# Patient Record
Sex: Male | Born: 1985 | Hispanic: Yes | Marital: Married | Smoking: Never smoker
Health system: Southern US, Community
[De-identification: ages and names within clinical notes are randomized; demographics above are authoritative.]

## PROBLEM LIST (undated history)

## (undated) DIAGNOSIS — J45909 Unspecified asthma, uncomplicated: Secondary | ICD-10-CM

## (undated) DIAGNOSIS — K219 Gastro-esophageal reflux disease without esophagitis: Secondary | ICD-10-CM

## (undated) HISTORY — DX: Gastro-esophageal reflux disease without esophagitis: K21.9

## (undated) HISTORY — DX: Unspecified asthma, uncomplicated: J45.909

---
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
## 2004-06-28 ENCOUNTER — Emergency Department (HOSPITAL_COMMUNITY): Admission: EM | Admit: 2004-06-28 | Discharge: 2004-06-28 | Payer: Self-pay | Admitting: Emergency Medicine

## 2004-07-10 ENCOUNTER — Emergency Department (HOSPITAL_COMMUNITY): Admission: EM | Admit: 2004-07-10 | Discharge: 2004-07-10 | Payer: Self-pay | Admitting: Family Medicine

## 2008-11-15 ENCOUNTER — Emergency Department (HOSPITAL_COMMUNITY): Admission: EM | Admit: 2008-11-15 | Discharge: 2008-11-15 | Payer: Self-pay | Admitting: Emergency Medicine

## 2009-04-08 ENCOUNTER — Emergency Department (HOSPITAL_COMMUNITY): Admission: EM | Admit: 2009-04-08 | Discharge: 2009-04-08 | Payer: Self-pay | Admitting: Emergency Medicine

## 2009-12-17 ENCOUNTER — Emergency Department (HOSPITAL_COMMUNITY): Admission: EM | Admit: 2009-12-17 | Discharge: 2009-12-17 | Payer: Self-pay | Admitting: Emergency Medicine

## 2010-06-19 LAB — RAPID STREP SCREEN (MED CTR MEBANE ONLY): Streptococcus, Group A Screen (Direct): NEGATIVE

## 2010-07-13 LAB — COMPREHENSIVE METABOLIC PANEL
ALT: 25 U/L (ref 0–53)
AST: 18 U/L (ref 0–37)
Calcium: 7.8 mg/dL — ABNORMAL LOW (ref 8.4–10.5)
GFR calc Af Amer: >60 mL/min (ref >60)
Glucose, Bld: 113 mg/dL — ABNORMAL HIGH (ref 70–99)
Sodium: 134 mEq/L — ABNORMAL LOW (ref 135–145)
Total Protein: 6.2 g/dL (ref 6.0–8.3)

## 2010-07-13 LAB — DIFFERENTIAL
Basophils Absolute: 0 10*3/uL (ref 0.0–0.1)
Lymphocytes Relative: 6 % — ABNORMAL LOW (ref 12–46)
Monocytes Relative: 7 % (ref 3–12)
Neutrophils Relative %: 87 % — ABNORMAL HIGH (ref 43–77)

## 2010-07-13 LAB — CBC
Platelets: 227 10*3/uL (ref 150–400)
RDW: 13.4 % (ref 11.5–15.5)

## 2010-07-13 LAB — CK: Total CK: 107 U/L (ref 7–232)

## 2010-12-01 ENCOUNTER — Emergency Department (HOSPITAL_COMMUNITY)
Admission: EM | Admit: 2010-12-01 | Discharge: 2010-12-02 | Disposition: A | Discharge status: Home / Self Care | Payer: Self-pay | Attending: Emergency Medicine | Admitting: Emergency Medicine

## 2010-12-01 DIAGNOSIS — H6690 Otitis media, unspecified, unspecified ear: Secondary | ICD-10-CM | POA: Insufficient documentation

## 2010-12-01 DIAGNOSIS — H9209 Otalgia, unspecified ear: Secondary | ICD-10-CM | POA: Insufficient documentation

## 2010-12-01 DIAGNOSIS — H9190 Unspecified hearing loss, unspecified ear: Secondary | ICD-10-CM | POA: Insufficient documentation

## 2010-12-01 DIAGNOSIS — H60399 Other infective otitis externa, unspecified ear: Secondary | ICD-10-CM | POA: Insufficient documentation

## 2010-12-15 ENCOUNTER — Emergency Department (HOSPITAL_COMMUNITY)
Admission: EM | Admit: 2010-12-15 | Discharge: 2010-12-15 | Disposition: A | Discharge status: Home / Self Care | Payer: Self-pay | Attending: Emergency Medicine | Admitting: Emergency Medicine

## 2010-12-15 DIAGNOSIS — H9210 Otorrhea, unspecified ear: Secondary | ICD-10-CM | POA: Insufficient documentation

## 2010-12-15 DIAGNOSIS — H60399 Other infective otitis externa, unspecified ear: Secondary | ICD-10-CM | POA: Insufficient documentation

## 2010-12-15 DIAGNOSIS — H9209 Otalgia, unspecified ear: Secondary | ICD-10-CM | POA: Insufficient documentation

## 2011-02-23 ENCOUNTER — Emergency Department (HOSPITAL_COMMUNITY)
Admission: EM | Admit: 2011-02-23 | Discharge: 2011-02-24 | Disposition: A | Discharge status: Home / Self Care | Payer: Self-pay | Attending: Emergency Medicine | Admitting: Emergency Medicine

## 2011-02-23 ENCOUNTER — Encounter: Payer: Self-pay | Admitting: *Deleted

## 2011-02-23 DIAGNOSIS — H9209 Otalgia, unspecified ear: Secondary | ICD-10-CM | POA: Insufficient documentation

## 2011-02-23 DIAGNOSIS — H9312 Tinnitus, left ear: Secondary | ICD-10-CM

## 2011-02-23 DIAGNOSIS — H6692 Otitis media, unspecified, left ear: Secondary | ICD-10-CM

## 2011-02-23 DIAGNOSIS — H9319 Tinnitus, unspecified ear: Secondary | ICD-10-CM | POA: Insufficient documentation

## 2011-02-23 DIAGNOSIS — H6690 Otitis media, unspecified, unspecified ear: Secondary | ICD-10-CM | POA: Insufficient documentation

## 2011-02-23 MED ORDER — ONDANSETRON HCL 4 MG/2ML IJ SOLN: 4.0000 mg | Freq: Four times a day (QID) | INTRAMUSCULAR | Status: DC | PRN

## 2011-02-23 MED ORDER — HYDROMORPHONE HCL 2 MG PO TABS: 2.0000 mg | ORAL_TABLET | ORAL | Status: DC | PRN

## 2011-02-23 NOTE — ED Notes (Signed)
He has had lt ear pain for 3-4 months and he has been here 3-4 times for the same complaint and it does not appear to be getting better

## 2011-02-24 MED ORDER — AMOXICILLIN-POT CLAVULANATE 875-125 MG PO TABS
1.0000 | ORAL_TABLET | Freq: Once | ORAL | Status: AC
  Administered 2011-02-24: 1 via ORAL
  Filled 2011-02-24: qty 1

## 2011-02-24 MED ORDER — AMOXICILLIN-POT CLAVULANATE 875-125 MG PO TABS: 1.0000 | ORAL_TABLET | Freq: Two times a day (BID) | ORAL | Status: AC

## 2011-02-24 NOTE — ED Provider Notes (Signed)
History     CSN: [id number] Arrival date & time: 02/23/2011 10:07 PM   First MD Initiated Contact with Patient 02/24/11 0043      Chief Complaint  Patient presents with  . Ear Fullness    (Consider location/radiation/quality/duration/timing/severity/associated sxs/prior treatment) HPI Comments: Patient with recurrent L ear pain , ringing in the ear has been seen in the ED several times but has not made appointment with ENT for followup   Patient is a 25 y.o. male presenting with plugged ear sensation. The history is provided by the patient.  Ear Fullness This is a recurrent problem. The current episode started more than 1 month ago. The problem occurs intermittently. The problem has been gradually worsening. Associated symptoms include a fever. Pertinent negatives include no congestion, coughing, nausea or neck pain. The symptoms are aggravated by drinking. He has tried nothing for the symptoms. The treatment provided no relief.    History reviewed. No pertinent past medical history.  History reviewed. No pertinent past surgical history.  History reviewed. No pertinent family history.  History  Substance Use Topics  . Smoking status: Never Smoker   . Smokeless tobacco: Not on file  . Alcohol Use: Yes      Review of Systems  Constitutional: Positive for fever.  HENT: Positive for ear pain and tinnitus. Negative for congestion and neck pain.   Eyes: Negative.   Respiratory: Negative for cough.   Cardiovascular: Negative.   Gastrointestinal: Negative for nausea.  Genitourinary: Negative.   Musculoskeletal: Negative.   Neurological: Negative.   Hematological: Negative.   Psychiatric/Behavioral: Negative.     Allergies  Review of patient's allergies indicates no known allergies.  Home Medications  No current outpatient prescriptions on file.  BP 119/76  Pulse 73  Temp(Src) 97.9 F (36.6 C) (Oral)  Resp 18  SpO2 97%  Physical Exam  Constitutional: He is  oriented to person, place, and time. He appears well-developed and well-nourished.  HENT:  Head: Atraumatic.  Right Ear: Hearing normal. There is tenderness. No middle ear effusion.  Left Ear: No mastoid tenderness. A middle ear effusion is present. Decreased hearing is noted.  Eyes: EOM are normal.  Neck: Neck supple.  Cardiovascular: Regular rhythm.   Pulmonary/Chest: Breath sounds normal.  Abdominal: Bowel sounds are normal.  Musculoskeletal: Normal range of motion.  Neurological: He is oriented to person, place, and time.  Skin: Skin is warm and dry.  Psychiatric: He has a normal mood and affect.    ED Course  Procedures (including critical care time)  Labs Reviewed - No data to display No results found.   No diagnosis found.    MDM  Tinnitis, verse OMwith effusion vs, otic tumor        Arman Filter, NP 02/24/11 0106

## 2011-02-24 NOTE — ED Provider Notes (Signed)
Medical screening examination/treatment/procedure(s) were performed by non-physician practitioner and as supervising physician I was immediately available for consultation/collaboration.   Carlisle Beers Mycah Formica, MD 02/24/11 0700

## 2011-07-30 ENCOUNTER — Emergency Department (HOSPITAL_COMMUNITY): Payer: Self-pay

## 2011-07-30 ENCOUNTER — Other Ambulatory Visit: Payer: Self-pay

## 2011-07-30 ENCOUNTER — Encounter (HOSPITAL_COMMUNITY): Payer: Self-pay | Admitting: Emergency Medicine

## 2011-07-30 ENCOUNTER — Emergency Department (HOSPITAL_COMMUNITY)
Admission: EM | Admit: 2011-07-30 | Discharge: 2011-07-30 | Disposition: A | Discharge status: Home / Self Care | Payer: Self-pay | Attending: Emergency Medicine | Admitting: Emergency Medicine

## 2011-07-30 DIAGNOSIS — R05 Cough: Secondary | ICD-10-CM | POA: Insufficient documentation

## 2011-07-30 DIAGNOSIS — R079 Chest pain, unspecified: Secondary | ICD-10-CM | POA: Insufficient documentation

## 2011-07-30 DIAGNOSIS — R0789 Other chest pain: Secondary | ICD-10-CM

## 2011-07-30 DIAGNOSIS — M546 Pain in thoracic spine: Secondary | ICD-10-CM | POA: Insufficient documentation

## 2011-07-30 DIAGNOSIS — R059 Cough, unspecified: Secondary | ICD-10-CM | POA: Insufficient documentation

## 2011-07-30 DIAGNOSIS — J40 Bronchitis, not specified as acute or chronic: Secondary | ICD-10-CM | POA: Insufficient documentation

## 2011-07-30 LAB — CBC
Hemoglobin: 16 g/dL (ref 13.0–17.0)
MCH: 31.1 pg (ref 26.0–34.0)
RBC: 5.15 MIL/uL (ref 4.22–5.81)

## 2011-07-30 LAB — BASIC METABOLIC PANEL
CO2: 24 mEq/L (ref 19–32)
Calcium: 9.3 mg/dL (ref 8.4–10.5)
Glucose, Bld: 112 mg/dL — ABNORMAL HIGH (ref 70–99)
Potassium: 3.9 mEq/L (ref 3.5–5.1)
Sodium: 139 mEq/L (ref 135–145)

## 2011-07-30 LAB — D-DIMER, QUANTITATIVE: D-Dimer, Quant: 0.31 ug/mL-FEU (ref 0.00–0.48)

## 2011-07-30 MED ORDER — ASPIRIN 325 MG PO TABS: 325.0000 mg | ORAL_TABLET | ORAL | Status: DC

## 2011-07-30 MED ORDER — IBUPROFEN 800 MG PO TABS
800.0000 mg | ORAL_TABLET | Freq: Once | ORAL | Status: AC
  Administered 2011-07-30: 800 mg via ORAL
  Filled 2011-07-30: qty 1

## 2011-07-30 MED ORDER — ALBUTEROL SULFATE HFA 108 (90 BASE) MCG/ACT IN AERS
2.0000 | INHALATION_SPRAY | RESPIRATORY_TRACT | Status: DC | PRN
  Filled 2011-07-30: qty 6.7

## 2011-07-30 NOTE — ED Provider Notes (Signed)
History     CSN: [id number]  Arrival date & time 07/30/11  1306   First MD Initiated Contact with Patient 07/30/11 1440      Chief Complaint  Patient presents with  . Chest Pain    (Consider location/radiation/quality/duration/timing/severity/associated sxs/prior treatment) HPI Patient presents with complaint of intermittent sharp pains in the middle of his chest which has been going on over the past few days. The chest pains come and go. He initially experienced pain while he was lying in bed. Pain is described as sharp lasting several seconds and then resolve spontaneously. He occasionally feels some pain in his mid upper back as well. He has no shortness of breath. He does describe having a cough which began yesterday. His family member at bedside states that she has heard him wheezing when he coughs. He denies any leg swelling he has no history of DVT or PE he's had no recent surgery or trauma and no recent travel. He's had no fever or chills. His cough is nonproductive. He's been eating and drinking normally. He has no nausea or diaphoresis radiation of the pain or shortness of breath associated. There are no alleviating or modifying factors. There no other associated systemic symptoms.  History reviewed. No pertinent past medical history.  History reviewed. No pertinent past surgical history.  No family history on file.  History  Substance Use Topics  . Smoking status: Current Some Day Smoker  . Smokeless tobacco: Not on file  . Alcohol Use: No      Review of Systems ROS reviewed and all otherwise negative except for mentioned in HPI  Allergies  Review of patient's allergies indicates no known allergies.  Home Medications  No current outpatient prescriptions on file.  BP 138/78  Pulse 89  Temp(Src) 97.6 F (36.4 C) (Oral)  Resp 20  Wt 210 lb (95.255 kg)  SpO2 96% Vitals reviewed Physical Exam Physical Examination: General appearance - alert, well appearing,  and in no distress Mental status - alert, oriented to person, place, and time Mouth - mucous membranes moist, pharynx normal without lesions Chest - clear to auscultation, no wheezes, rales or rhonchi, symmetric air entry, mild ttp of anterior chest wall- costochondral junction Heart - normal rate, regular rhythm, normal S1, S2, no murmurs, rubs, clicks or gallops Abdomen - soft, nontender, nondistended, no masses or organomegaly Extremities - peripheral pulses normal, no pedal edema, no clubbing or cyanosis Skin - normal coloration and turgor, no rashes, no suspicious skin lesions noted Psych-laughing and jovial during exam  ED Course  Procedures (including critical care time)   Date: 07/30/2011  Rate: 96  Rhythm: normal sinus rhythm  QRS Axis: rightward  Intervals: normal  ST/T Wave abnormalities: normal  Conduction Disutrbances:none  Narrative Interpretation:   Old EKG Reviewed: none available    2:49 PM went to see patient, he is gone for xray  Labs Reviewed  CBC - Abnormal; Notable for the following:    WBC 12.1 (*)    All other components within normal limits  BASIC METABOLIC PANEL - Abnormal; Notable for the following:    Glucose, Bld 112 (*)    All other components within normal limits  PRO B NATRIURETIC PEPTIDE  D-DIMER, QUANTITATIVE   Dg Chest 2 View  07/30/2011  *RADIOLOGY REPORT*  Clinical Data: Chest pain  CHEST - 2 VIEW  Comparison: None  Findings: The heart size and mediastinal contours are within normal limits.  Both lungs are clear.  The visualized skeletal structures  are unremarkable.  IMPRESSION: Negative exam.  Original Report Authenticated By: Rosealee Albee, M.D.     1. Bronchitis   2. Chest wall pain       MDM  Patient presenting with sharp pains in his chest intermittently over the past 2 days. He also has a cough. His workup today in the ED revealed no significant abnormalities of his EKG. He is very low risk for acute coronary syndrome and  I do not believe his symptoms represent acute coronary syndrome. He was tachycardiac so a d-dimer was performed which was within normal range. His chest x-ray did not show any abnormalities. I suspect this pain to be musculoskeletal in nature or related to possible bronchitis. I recommended he take ibuprofen for discomfort and he was given an albuterol inhaler in the emergency department. He was given strict return precautions and patient is agreeable with this plan the        Ethelda Chick, MD 07/30/11 1651

## 2011-07-30 NOTE — ED Notes (Signed)
Pt reports CP onset last night, radiates to back and is associated with SOB, no other complaints,NAD

## 2011-07-30 NOTE — Discharge Instructions (Signed)
Return to the ED with any concerns including difficulty breathing, swelling in your legs, fainting, or any other alarming symptoms.  He should use the albuterol inhaler 2 puffs every 4 hours as needed for cough.

## 2011-07-31 ENCOUNTER — Encounter: Payer: Self-pay | Admitting: *Deleted

## 2011-10-02 ENCOUNTER — Emergency Department (HOSPITAL_COMMUNITY)
Admission: EM | Admit: 2011-10-02 | Discharge: 2011-10-03 | Disposition: A | Discharge status: Home / Self Care | Payer: Self-pay | Attending: Emergency Medicine | Admitting: Emergency Medicine

## 2011-10-02 ENCOUNTER — Emergency Department (HOSPITAL_COMMUNITY): Payer: Self-pay

## 2011-10-02 ENCOUNTER — Encounter (HOSPITAL_COMMUNITY): Payer: Self-pay | Admitting: Emergency Medicine

## 2011-10-02 DIAGNOSIS — R109 Unspecified abdominal pain: Secondary | ICD-10-CM | POA: Insufficient documentation

## 2011-10-02 DIAGNOSIS — R079 Chest pain, unspecified: Secondary | ICD-10-CM | POA: Insufficient documentation

## 2011-10-02 DIAGNOSIS — R0789 Other chest pain: Secondary | ICD-10-CM

## 2011-10-02 DIAGNOSIS — R071 Chest pain on breathing: Secondary | ICD-10-CM | POA: Insufficient documentation

## 2011-10-02 DIAGNOSIS — R5381 Other malaise: Secondary | ICD-10-CM | POA: Insufficient documentation

## 2011-10-02 DIAGNOSIS — R0602 Shortness of breath: Secondary | ICD-10-CM | POA: Insufficient documentation

## 2011-10-02 MED ORDER — IBUPROFEN 800 MG PO TABS
800.0000 mg | ORAL_TABLET | Freq: Once | ORAL | Status: AC
  Administered 2011-10-02: 800 mg via ORAL
  Filled 2011-10-02: qty 1

## 2011-10-02 MED ORDER — ALBUTEROL SULFATE HFA 108 (90 BASE) MCG/ACT IN AERS
2.0000 | INHALATION_SPRAY | RESPIRATORY_TRACT | Status: DC | PRN
  Administered 2011-10-02: 2 via RESPIRATORY_TRACT
  Filled 2011-10-02: qty 6.7

## 2011-10-02 NOTE — ED Notes (Signed)
Updated in w/r 

## 2011-10-02 NOTE — ED Notes (Signed)
Reported wait time.  Blankets offered 

## 2011-10-02 NOTE — ED Notes (Signed)
Pt. Stated, I was at work 2 days ago and i started having some rt. Side rib pain,  Comes and goes.  Either moving or being still

## 2011-10-02 NOTE — ED Notes (Signed)
Pt c/o sharp pain , RUQ,  sharp onset 2 days ago but constant since last PM.  Pain worsens with deep cough.  Denies nausea, vomiing, diarrhea, chest pain.  C/o of difficulty breathing at rest. Denies prior episode of same.

## 2011-10-02 NOTE — ED Notes (Signed)
Status unchanged.  Awaiting plan of care.

## 2011-10-03 MED ORDER — AZITHROMYCIN 250 MG PO TABS: ORAL_TABLET | ORAL | Status: AC

## 2011-10-03 NOTE — Discharge Instructions (Signed)
Return to the ED with any concerns including difficulty breathing, fainting, leg swelling, fever, decreased level of alertness/lethargy, or any other alarming symptoms  You should use the albuterol inhaler 2 puffs every 4 hours  Your CXR did not show a definite pneumonia, but there may be an area of early pneumonia- antibiotic prescription given

## 2011-10-03 NOTE — ED Provider Notes (Signed)
History     CSN: [id number]  Arrival date & time 10/02/11  1331   First MD Initiated Contact with Patient 10/02/11 2039      Chief Complaint  Patient presents with  . Abdominal Pain    (Consider location/radiation/quality/duration/timing/severity/associated sxs/prior treatment) HPI Patient presents with complaint of right-sided pain in his ribs. He states that the pain has been going on for the past 2 days. Pain is sharp and fleeting in nature. It is not associated with movement or palpation. He does describe a mild cough which is nonproductive. He's had no fever or chills. No leg swelling and no history of DVT or PE. He has had no recent surgery or travel or trauma. He has not tried any treatments for his pain. He has had similar symptoms in the past and was treated for bronchitis and improved. There no other alleviating or modifying factors. There no other associated systemic symptoms.  History reviewed. No pertinent past medical history.  History reviewed. No pertinent past surgical history.  No family history on file.  History  Substance Use Topics  . Smoking status: Current Some Day Smoker  . Smokeless tobacco: Not on file  . Alcohol Use: No      Review of Systems ROS reviewed and all otherwise negative except for mentioned in HPI Allergies  Review of patient's allergies indicates no known allergies.  Home Medications   Current Outpatient Rx  Name Route Sig Dispense Refill  . AZITHROMYCIN 250 MG PO TABS  Take 2 tabs po on Day 1, then 1 tab po qD x total of 5 days 6 each 0    BP 132/86  Pulse 80  Temp 97.6 F (36.4 C) (Oral)  Resp 16  SpO2 98% Vitals reviewed Physical Exam Physical Examination: General appearance - alert, well appearing, and in no distress Mental status - alert, oriented to person, place, and time Eyes - pupils equal and reactive, no scleral icterus or conjunctival injection Mouth - mucous membranes moist, pharynx normal without  lesions Chest - clear to auscultation, no wheezes, rales or rhonchi, symmetric air entry, chest wall nontender, no crepitus Heart - normal rate, regular rhythm, normal S1, S2, no murmurs, rubs, clicks or gallops Abdomen - soft, nontender, nondistended, no masses or organomegaly Musculoskeletal - no joint tenderness, deformity or swelling Extremities - peripheral pulses normal, no pedal edema, no clubbing or cyanosis Skin - normal coloration and turgor, no rashes  ED Course  Procedures (including critical care time)  Labs Reviewed - No data to display Dg Chest 2 View  10/02/2011  *RADIOLOGY REPORT*  Clinical Data: Right side chest pain, shortness of breath, weakness, occasional smoker  CHEST - 2 VIEW  Comparison: None  Findings: Upper-normal size of cardiac silhouette. Mediastinal contours and pulmonary vascularity normal. Minimal bronchitic changes and right basilar atelectasis. Lungs otherwise clear. No pleural effusion or pneumothorax. No acute osseous findings.  IMPRESSION: Minimal bronchitic changes and right basilar atelectasis.  Original Report Authenticated By: Lollie Marrow, M.D.     1. Chest wall pain       MDM  Patient presenting with right-sided chest wall pain with associated mild cough. Chest x-ray is reassuring.  I doubt ACS or other emergent condition at this time. Patient was treated with albuterol inhaler and advised ibuprofen for soreness. He was given strict return precautions and is agreeable with this plan.       Ethelda Chick, MD 10/04/11 [phone]

## 2012-02-01 ENCOUNTER — Encounter (HOSPITAL_COMMUNITY): Payer: Self-pay | Admitting: *Deleted

## 2012-02-01 ENCOUNTER — Emergency Department (HOSPITAL_COMMUNITY)
Admission: EM | Admit: 2012-02-01 | Discharge: 2012-02-01 | Disposition: A | Discharge status: Home / Self Care | Payer: Self-pay | Attending: Emergency Medicine | Admitting: Emergency Medicine

## 2012-02-01 DIAGNOSIS — R0789 Other chest pain: Secondary | ICD-10-CM | POA: Insufficient documentation

## 2012-02-01 DIAGNOSIS — M549 Dorsalgia, unspecified: Secondary | ICD-10-CM | POA: Insufficient documentation

## 2012-02-01 DIAGNOSIS — H538 Other visual disturbances: Secondary | ICD-10-CM | POA: Insufficient documentation

## 2012-02-01 LAB — CBC WITH DIFFERENTIAL/PLATELET
Basophils Absolute: 0 10*3/uL (ref 0.0–0.1)
Eosinophils Relative: 3 % (ref 0–5)
HCT: 42.2 % (ref 39.0–52.0)
Lymphocytes Relative: 21 % (ref 12–46)
Lymphs Abs: 2 10*3/uL (ref 0.7–4.0)
MCV: 86.8 fL (ref 78.0–100.0)
Monocytes Absolute: 1 10*3/uL (ref 0.1–1.0)
RDW: 12.8 % (ref 11.5–15.5)
WBC: 9.4 10*3/uL (ref 4.0–10.5)

## 2012-02-01 LAB — COMPREHENSIVE METABOLIC PANEL
BUN: 18 mg/dL (ref 6–23)
CO2: 26 mEq/L (ref 19–32)
Calcium: 9.1 mg/dL (ref 8.4–10.5)
Creatinine, Ser: 0.92 mg/dL (ref 0.50–1.35)
GFR calc Af Amer: >90 mL/min (ref >90)
GFR calc non Af Amer: >90 mL/min (ref >90)
Glucose, Bld: 104 mg/dL — ABNORMAL HIGH (ref 70–99)

## 2012-02-01 LAB — TROPONIN I: Troponin I: <0.3 ng/mL (ref <0.30)

## 2012-02-01 NOTE — ED Notes (Signed)
Pt to ED c/o numbness to legs, blurred vision and chest pressure when he walks.  After he sits down for a few minutes, all the symptoms resolve.  Pt came in today b/c he was at work and his boss told him to come to ED.

## 2012-02-01 NOTE — ED Provider Notes (Signed)
History     CSN: [id number]  Arrival date & time 02/01/12  0865   First MD Initiated Contact with Patient 02/01/12 [phone]      Chief Complaint  Patient presents with  . Numbness  . Blurred Vision    (Consider location/radiation/quality/duration/timing/severity/associated sxs/prior treatment) HPI Comments: The patient presents here with numbness in legs, chest pains, back pain, and blurry vision that occurs when he walks.  This has happened four times this morning and several times each day over the past few days.  There is no headache, bowel or bladder complaints, and otherwise denies feeling sick or ill the past few days.  The symptoms are relieved with rest.    He has no significant past medical history.    Patient is a 26 y.o. male presenting with weakness. The history is provided by the patient.  Weakness The primary symptoms include focal weakness. The symptoms are waxing and waning. The neurological symptoms are focal. The symptoms occurred on exertion.  Additional symptoms include weakness and lower back pain.    History reviewed. No pertinent past medical history.  History reviewed. No pertinent past surgical history.  No family history on file.  History  Substance Use Topics  . Smoking status: Former Games developer  . Smokeless tobacco: Not on file  . Alcohol Use: No      Review of Systems  Neurological: Positive for focal weakness and weakness.  All other systems reviewed and are negative.    Allergies  Review of patient's allergies indicates no known allergies.  Home Medications  No current outpatient prescriptions on file.  BP 133/95  Pulse 85  Temp 97.6 F (36.4 C) (Oral)  Resp 18  SpO2 96%  Physical Exam  Nursing note and vitals reviewed. Constitutional: He is oriented to person, place, and time. He appears well-developed and well-nourished. No distress.  HENT:  Head: Normocephalic and atraumatic.  Mouth/Throat: Oropharynx is clear and moist.    Eyes: EOM are normal. Pupils are equal, round, and reactive to light.  Neck: Normal range of motion. Neck supple.  Cardiovascular: Normal rate and regular rhythm.   No murmur heard. Pulmonary/Chest: Effort normal and breath sounds normal. No respiratory distress.  Abdominal: Soft. Bowel sounds are normal. He exhibits no distension.  Musculoskeletal: Normal range of motion. He exhibits no edema.  Lymphadenopathy:    He has no cervical adenopathy.  Neurological: He is alert and oriented to person, place, and time.       Strength is 5/5 in the bue, ble.  DTR's are 2+ and equal in the ble.  Able to ambulate without any deficits.  Skin: Skin is warm and dry. He is not diaphoretic.    ED Course  Procedures (including critical care time)   Labs Reviewed  CBC WITH DIFFERENTIAL  COMPREHENSIVE METABOLIC PANEL  TROPONIN I   No results found.   No diagnosis found.   Date: 02/01/2012  Rate: 90  Rhythm: normal sinus rhythm  QRS Axis: normal  Intervals: normal  ST/T Wave abnormalities: normal  Conduction Disutrbances:none  Narrative Interpretation:   Old EKG Reviewed: none available    MDM  The patient presents here with a constellation of symptoms that includes back pain, leg numbness, chest tightness, and blurry vision.  I am unsure as to how to connect all of these symptoms, but his exam is non-focal and workup is unremarkable.  There is no evidence of anemia, electrolyte abnormality, and blood sugar is normal.  The ekg is unremarkable and  I doubt any cardiac abnormality.  He will be discharged to home, to return prn if there are any problems.          Geoffery Lyons, MD 02/01/12 [phone]

## 2012-02-15 ENCOUNTER — Encounter (HOSPITAL_COMMUNITY): Payer: Self-pay | Admitting: *Deleted

## 2012-02-15 ENCOUNTER — Emergency Department (HOSPITAL_COMMUNITY)
Admission: EM | Admit: 2012-02-15 | Discharge: 2012-02-15 | Disposition: A | Discharge status: Home / Self Care | Payer: Self-pay | Attending: Emergency Medicine | Admitting: Emergency Medicine

## 2012-02-15 DIAGNOSIS — J039 Acute tonsillitis, unspecified: Secondary | ICD-10-CM | POA: Insufficient documentation

## 2012-02-15 DIAGNOSIS — J329 Chronic sinusitis, unspecified: Secondary | ICD-10-CM

## 2012-02-15 DIAGNOSIS — R1110 Vomiting, unspecified: Secondary | ICD-10-CM | POA: Insufficient documentation

## 2012-02-15 DIAGNOSIS — J069 Acute upper respiratory infection, unspecified: Secondary | ICD-10-CM | POA: Insufficient documentation

## 2012-02-15 DIAGNOSIS — J019 Acute sinusitis, unspecified: Secondary | ICD-10-CM | POA: Insufficient documentation

## 2012-02-15 DIAGNOSIS — Z87891 Personal history of nicotine dependence: Secondary | ICD-10-CM | POA: Insufficient documentation

## 2012-02-15 MED ORDER — FLUTICASONE PROPIONATE 50 MCG/ACT NA SUSP: 2.0000 | Freq: Every day | NASAL | Status: DC

## 2012-02-15 MED ORDER — SULFAMETHOXAZOLE-TRIMETHOPRIM 800-160 MG PO TABS: 1.0000 | ORAL_TABLET | Freq: Two times a day (BID) | ORAL | Status: DC

## 2012-02-15 NOTE — ED Provider Notes (Signed)
Medical screening examination/treatment/procedure(s) were performed by non-physician practitioner and as supervising physician I was immediately available for consultation/collaboration.  Tobin Chad, MD 02/15/12 [phone]

## 2012-02-15 NOTE — ED Provider Notes (Signed)
History     CSN: [id number]  Arrival date & time 02/15/12  4098   First MD Initiated Contact with Patient 02/15/12 0615      No chief complaint on file.   (Consider location/radiation/quality/duration/timing/severity/associated sxs/prior treatment) HPI Comments: 26 year old male presents to the emergency department complaining of worsening cough over the past week. States he coughed so much last night that caused him to vomit up mucus. Cough worse at night. Admits to associated nasal and head congestion along with sore throat worse when he eats. He is beginning to feel pressure behind his eyes. Denies fever, chills, nausea, chest pain, shortness of breath. States he is just beginning to feel "wiped out". He tried using throat lozenges without any relief.  The history is provided by the patient.    History reviewed. No pertinent past medical history.  History reviewed. No pertinent past surgical history.  No family history on file.  History  Substance Use Topics  . Smoking status: Former Games developer  . Smokeless tobacco: Not on file  . Alcohol Use: No      Review of Systems  Constitutional: Positive for fatigue. Negative for fever and chills.  HENT: Positive for congestion, sore throat, trouble swallowing, postnasal drip and sinus pressure.   Eyes: Negative for discharge.  Respiratory: Positive for cough.   Cardiovascular: Negative for chest pain.  Gastrointestinal: Positive for vomiting. Negative for nausea.  Skin: Negative for rash.  Neurological: Positive for headaches.    Allergies  Review of patient's allergies indicates no known allergies.  Home Medications   Current Outpatient Rx  Name  Route  Sig  Dispense  Refill  . FLUTICASONE PROPIONATE 50 MCG/ACT NA SUSP   Nasal   Place 2 sprays into the nose daily.   16 g   1   . SULFAMETHOXAZOLE-TRIMETHOPRIM 800-160 MG PO TABS   Oral   Take 1 tablet by mouth 2 (two) times daily. One po bid x 7 days   14 tablet    0     BP 149/89  Pulse 87  Temp 98.1 F (36.7 C) (Oral)  Resp 20  SpO2 94%  Physical Exam  Nursing note and vitals reviewed. Constitutional: He is oriented to person, place, and time. He appears well-developed and well-nourished. No distress.  HENT:  Head: Normocephalic and atraumatic.  Right Ear: Tympanic membrane, external ear and ear canal normal.  Left Ear: Tympanic membrane, external ear and ear canal normal.  Nose: Mucosal edema and rhinorrhea (green) present. Right sinus exhibits no maxillary sinus tenderness and no frontal sinus tenderness. Left sinus exhibits no maxillary sinus tenderness and no frontal sinus tenderness.  Mouth/Throat: Posterior oropharyngeal edema and posterior oropharyngeal erythema present. No oropharyngeal exudate.       Tonsils enlarged and inflammed bilaterally without exudate. Positive post nasal drip noted.  Eyes: Conjunctivae normal and EOM are normal.  Neck: Normal range of motion. Neck supple.  Cardiovascular: Normal rate, regular rhythm and normal heart sounds.   Pulmonary/Chest: Effort normal and breath sounds normal. He has no wheezes. He has no rales.  Abdominal: Soft. Bowel sounds are normal. There is no tenderness.  Musculoskeletal: Normal range of motion.  Lymphadenopathy:       Head (right side): Submandibular and tonsillar adenopathy present.       Head (left side): Submandibular and tonsillar adenopathy present.    He has cervical adenopathy.       Right cervical: Superficial cervical adenopathy present.       Left cervical:  Superficial cervical adenopathy present.  Neurological: He is alert and oriented to person, place, and time.  Skin: Skin is warm and dry.  Psychiatric: He has a normal mood and affect. His behavior is normal.    ED Course  Procedures (including critical care time)  Labs Reviewed - No data to display No results found.   1. Tonsillitis   2. Cough   3. Upper respiratory infection       MDM   Tonsillitis/Sinusitis/URI going on for 1 week with cough. Green mucus present in nose with mucosal edema and post nasal drip. Adenopathy palpable and tender. Rx bactrim and flonase. Advised saline nasal rinses and salt water gargles. Return precautions discussed.       Trevor Mace, PA-C 02/15/12 [phone]

## 2012-02-15 NOTE — ED Notes (Signed)
Patient presents to ed c/o cough states this started 1 week ago states he was coughing this am until he vomited.

## 2012-11-09 ENCOUNTER — Encounter (HOSPITAL_COMMUNITY): Payer: Self-pay

## 2012-11-09 ENCOUNTER — Emergency Department (HOSPITAL_COMMUNITY)
Admission: EM | Admit: 2012-11-09 | Discharge: 2012-11-09 | Disposition: A | Discharge status: Home / Self Care | Payer: Self-pay | Attending: Emergency Medicine | Admitting: Emergency Medicine

## 2012-11-09 ENCOUNTER — Emergency Department (HOSPITAL_COMMUNITY): Payer: Self-pay

## 2012-11-09 DIAGNOSIS — Z87891 Personal history of nicotine dependence: Secondary | ICD-10-CM | POA: Insufficient documentation

## 2012-11-09 DIAGNOSIS — M25569 Pain in unspecified knee: Secondary | ICD-10-CM | POA: Insufficient documentation

## 2012-11-09 DIAGNOSIS — G43909 Migraine, unspecified, not intractable, without status migrainosus: Secondary | ICD-10-CM | POA: Insufficient documentation

## 2012-11-09 DIAGNOSIS — M25561 Pain in right knee: Secondary | ICD-10-CM

## 2012-11-09 MED ORDER — IBUPROFEN 400 MG PO TABS: 600.0000 mg | ORAL_TABLET | Freq: Four times a day (QID) | ORAL | Status: DC | PRN

## 2012-11-09 MED ORDER — IBUPROFEN 600 MG PO TABS: 600.0000 mg | ORAL_TABLET | Freq: Four times a day (QID) | ORAL | Status: DC | PRN

## 2012-11-09 MED ORDER — IBUPROFEN 400 MG PO TABS
400.0000 mg | ORAL_TABLET | ORAL | Status: DC | PRN
  Administered 2012-11-09: 400 mg via ORAL
  Filled 2012-11-09: qty 1

## 2012-11-09 MED ORDER — IBUPROFEN 400 MG PO TABS: 600.0000 mg | ORAL_TABLET | ORAL | Status: DC | PRN

## 2012-11-09 NOTE — ED Provider Notes (Signed)
CSN: [id number]     Arrival date & time 11/09/12  4098 History     First MD Initiated Contact with Patient 11/09/12 [phone]     Chief Complaint  Patient presents with  . Headache  . Knee Pain   (Consider location/radiation/quality/duration/timing/severity/associated sxs/prior Treatment) HPI 27 y o male, with no signif past med hx.  Presented to ay with C/o headache- of 3 days duration, on the Lt side of his head, no prior hx, described as throbbing, somebody knocking on the side of his head, 6/10 severity currently, usu gets worse, intermittent, with some mild blurring of his vision bilat. Pt likes to stay in a dark, quiet room when he has the pain, but headaches not aggrav by light, no weakness of his extremities, no aura. No fever, nausea, stabbing pains in his eyes, no rhinorrhea, no hx of drug abuse,no hx of trauma, no family hx of similar headaches, no neck stiffness . Reported having some episodes of dizziness yesterday, and so had to take freq breaks at work. Px reports having headaches previously but they usually resolve after he wakes up from sleep. C/o of Rt knee pain for the past month- reports its worse after he has been sitting for a while, knee is initially stiff, and then with movement pain resolves. Pt does a lot of heavy lifting at work, he makes wood palets. No hx of trauma. No other joint pains.   History reviewed. No pertinent past medical history. History reviewed. No pertinent past surgical history. No family history on file. History  Substance Use Topics  . Smoking status: Former Games developer  . Smokeless tobacco: Not on file  . Alcohol Use: No    Review of Systems No pertinent findings.  Allergies  Review of patient's allergies indicates no known allergies.  Home Medications   Current Outpatient Rx  Name  Route  Sig  Dispense  Refill  . ibuprofen (ADVIL,MOTRIN) 600 MG tablet   Oral   Take 1 tablet (600 mg total) by mouth every 6 (six) hours as needed for pain.  30 tablet   0    BP 118/81  Pulse 74  Temp(Src) 97.9 F (36.6 C) (Oral)  Resp 16  SpO2 98% Physical Exam  Gen- Alert, co-op, not in any distress. HEENT- atraumatic, normocephalic, PERRL, EOMI, Fundi- appears benign, no tenderness to palpation of Lt side of his head.  Cardiac- RRR, no murmurs, rubs or gallops. Resp- Clear to auscultation bilat. Neuro- OTPP, Cr N- 2-12 intact.  Extr- 2+ pilse bilat in extremities, normal strength in all extremities, Rt knee- no redness, not tender, no crepitus, normal range of motion.   ED Course   Procedures (including critical care time)  Labs Reviewed - No data to display Dg Knee 2 Views Right  11/09/2012   *RADIOLOGY REPORT*  Clinical Data: Pain  RIGHT KNEE - 1-2 VIEW  Comparison: None.  Findings: Frontal and lateral views were obtained.  There is no fracture, dislocation, or effusion.  Joint spaces appear intact. No erosive change.  IMPRESSION: No abnormality noted.   Original Report Authenticated By: Bretta Bang, M.D.   Diagnosis- Migraine headaches.                  - Knee pain  MDM   Headaches, Possibly Migraines - Ibuprofen- 400mg  PO stat. If pain does not resolve then consider Sumatriptan. No focal neurologic deficits or concerning features to warrant Neuroimaging at this time. -D/c home- Ibuprofen- 600mg  Q6H. -To return to work  tomorrow.  - If NSAIDS provides no relief, told he could report back, and possibly start Sumatriptan.  Knee pain- Considering joint as been subject to wear and tear due to his occupation, possible early osteoarthritis. - Xray -2DG view of involved joint- Joint spaces appear intact. No erosive change. No abnormality seen. - On Nsaids given for headaches, will help with joint pain.   Kennis Carina, MD 11/09/12 1542

## 2012-11-09 NOTE — ED Notes (Signed)
Pt transported to xray 

## 2012-11-09 NOTE — ED Notes (Signed)
4098  Pt arrives to the ED with sharp pain only in the left side of his head in the temporal area that has been going on for the past 3 days.  Pt is also having dizzy spells with this having to sit down at work every 30 min.  Pt is also having right knee pain when he sits down for a long period of time then stands up it is hard to walk on the knee

## 2012-11-09 NOTE — ED Notes (Addendum)
Pt states L sided temporal pain x3 days. Also states dizziness and blurred vision. States he is having to take frequent breaks at work because he feels terrible. He is alert and oriented x4. No neurological deficits noted. Able to move all extremities without difficulty. No signs of distress noted. Pt also states R knee pain.

## 2012-11-10 NOTE — ED Provider Notes (Signed)
I saw and evaluated the patient, reviewed the resident's note and I agree with the findings and plan.   .Face to face Exam:  General:  Awake HEENT:  Atraumatic Resp:  Normal effort Abd:  Nondistended Neuro:No focal weakness  Nelia Shi, MD 11/10/12 [phone]

## 2013-01-13 ENCOUNTER — Emergency Department (HOSPITAL_COMMUNITY)
Admission: EM | Admit: 2013-01-13 | Discharge: 2013-01-13 | Disposition: A | Discharge status: Home / Self Care | Payer: Self-pay | Attending: Emergency Medicine | Admitting: Emergency Medicine

## 2013-01-13 ENCOUNTER — Encounter (HOSPITAL_COMMUNITY): Payer: Self-pay | Admitting: Emergency Medicine

## 2013-01-13 ENCOUNTER — Emergency Department (HOSPITAL_COMMUNITY): Payer: Self-pay

## 2013-01-13 DIAGNOSIS — Z87891 Personal history of nicotine dependence: Secondary | ICD-10-CM | POA: Insufficient documentation

## 2013-01-13 DIAGNOSIS — R059 Cough, unspecified: Secondary | ICD-10-CM | POA: Insufficient documentation

## 2013-01-13 DIAGNOSIS — R05 Cough: Secondary | ICD-10-CM

## 2013-01-13 DIAGNOSIS — J029 Acute pharyngitis, unspecified: Secondary | ICD-10-CM | POA: Insufficient documentation

## 2013-01-13 LAB — RAPID STREP SCREEN (MED CTR MEBANE ONLY): Streptococcus, Group A Screen (Direct): NEGATIVE

## 2013-01-13 NOTE — ED Provider Notes (Signed)
Medical screening examination/treatment/procedure(s) were performed by non-physician practitioner and as supervising physician I was immediately available for consultation/collaboration.   David Masneri, MD 01/13/13 2144 

## 2013-01-13 NOTE — ED Notes (Signed)
Pt woke up this am with sore throat and sts has blood tinged sputum when coughs.

## 2013-01-13 NOTE — ED Provider Notes (Signed)
CSN: [id number]     Arrival date & time 01/13/13  0801 History   First MD Initiated Contact with Patient 01/13/13 [phone]     Chief Complaint  Patient presents with  . Sore Throat   (Consider location/radiation/quality/duration/timing/severity/associated sxs/prior Treatment) Patient is a 27 y.o. male presenting with pharyngitis. The history is provided by the patient and medical records.  Sore Throat Associated symptoms include coughing and a sore throat.   This is a 27 year old Hispanic male with no significant past medical history presenting to the ED for sore throat and productive cough times one day. Patient states sputum is white with what he thinks might be some bloody streaking but is not sure.  Pain with swallowing liquids and solids but no difficulty doing so. 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  States sometimes chemicals can be released into the air causing some similar sx but usually not this severe.  Denies any recent sick contacts, fever, sweats, or chills. No recent travel, surgeries, prolonged periods of immobilization, lower cavity edema, or calf pain.  Denies any chest pain, SOB, or palpitations.  No nasal congestion, PND, sinus pressure, headaches, body aches, nausea, vomiting, or diarrhea.  History reviewed. No pertinent past medical history. History reviewed. No pertinent past surgical history. History reviewed. No pertinent family history. History  Substance Use Topics  . Smoking status: Former Games developer  . Smokeless tobacco: Not on file  . Alcohol Use: No    Review of Systems  HENT: Positive for sore throat.   Respiratory: Positive for cough.   All other systems reviewed and are negative.    Allergies  Review of patient's allergies indicates no known allergies.  Home Medications  No current outpatient prescriptions on file. BP 141/81  Pulse 93  Temp(Src) 97.7 F (36.5 C) (Oral)  Resp 18  Ht 5\' 7"  (1.702 m)  Wt 215 lb  (97.523 kg)  BMI 33.67 kg/m2  SpO2 98%  Physical Exam  Nursing note and vitals reviewed. Constitutional: He is oriented to person, place, and time. He appears well-developed and well-nourished. No distress.  HENT:  Head: Normocephalic and atraumatic.  Right Ear: Tympanic membrane and ear canal normal.  Left Ear: Tympanic membrane and ear canal normal.  Nose: Nose normal.  Mouth/Throat: Uvula is midline and mucous membranes are normal. Posterior oropharyngeal erythema present. No oropharyngeal exudate, posterior oropharyngeal edema or tonsillar abscesses.  Mild posterior or pharyngeal erythema, tonsils 1+ bilaterally without exudate, handling secretions appropriately, speaking in full complete sentences without difficulty  Eyes: Conjunctivae and EOM are normal. Pupils are equal, round, and reactive to light.  Neck: Normal range of motion.  Cardiovascular: Normal rate, regular rhythm and normal heart sounds.   Pulmonary/Chest: Effort normal and breath sounds normal. No respiratory distress. He has no wheezes.  Musculoskeletal: Normal range of motion.  Lymphadenopathy:    He has cervical adenopathy (left anterior chain).  Neurological: He is alert and oriented to person, place, and time.  Skin: Skin is warm and dry. He is not diaphoretic.  Psychiatric: He has a normal mood and affect.    ED Course  Procedures (including critical care time) Labs Review Labs Reviewed  RAPID STREP SCREEN   Imaging Review Dg Chest 2 View  01/13/2013   CLINICAL DATA:  Shortness of breath, productive cough, smoker  EXAM: CHEST  2 VIEW  COMPARISON:  10/02/2011  FINDINGS: Minimal enlargement of cardiac silhouette.  Mediastinal contours and pulmonary vascularity.  Minimal chronic bronchitic changes.  No acute infiltrate, pleural effusion or pneumothorax.  No acute osseous findings.  IMPRESSION: Minimal chronic bronchitic changes.  No acute abnormalities.   Electronically Signed   By: Ulyses Southward M.D.   On:  01/13/2013 08:45    EKG Interpretation   None       MDM   1. Sore throat   2. Cough    CXR with chronic bronchitic changes, no acute disease.  Rapid strep negative, culture pending.  Pt is not coughing in room, no signs sx concerning for PE.  Pt afebrile, non-toxic appearing, NAD, VS stable- ok for discharge.  Advised to use OTC chloraseptic spray for sore throat.  FU with [facility] if no improvement in the next few days.  Discussed plan with pt, he agreed.  Return precautions advised.  Garlon Hatchet, PA-C 01/13/13 1316

## 2013-01-15 LAB — CULTURE, GROUP A STREP

## 2013-10-30 ENCOUNTER — Emergency Department (HOSPITAL_COMMUNITY): Payer: Self-pay

## 2013-10-30 ENCOUNTER — Emergency Department (HOSPITAL_COMMUNITY)
Admission: EM | Admit: 2013-10-30 | Discharge: 2013-10-30 | Disposition: A | Discharge status: Home / Self Care | Payer: Self-pay | Attending: Emergency Medicine | Admitting: Emergency Medicine

## 2013-10-30 ENCOUNTER — Encounter (HOSPITAL_COMMUNITY): Payer: Self-pay | Admitting: Emergency Medicine

## 2013-10-30 DIAGNOSIS — R079 Chest pain, unspecified: Secondary | ICD-10-CM | POA: Insufficient documentation

## 2013-10-30 DIAGNOSIS — R062 Wheezing: Secondary | ICD-10-CM | POA: Insufficient documentation

## 2013-10-30 DIAGNOSIS — R0789 Other chest pain: Secondary | ICD-10-CM

## 2013-10-30 DIAGNOSIS — R05 Cough: Secondary | ICD-10-CM

## 2013-10-30 DIAGNOSIS — Z87891 Personal history of nicotine dependence: Secondary | ICD-10-CM | POA: Insufficient documentation

## 2013-10-30 DIAGNOSIS — R059 Cough, unspecified: Secondary | ICD-10-CM | POA: Insufficient documentation

## 2013-10-30 LAB — TROPONIN I: Troponin I: <0.3 ng/mL (ref <0.30)

## 2013-10-30 MED ORDER — ALBUTEROL SULFATE HFA 108 (90 BASE) MCG/ACT IN AERS: 2.0000 | INHALATION_SPRAY | RESPIRATORY_TRACT | Status: DC | PRN

## 2013-10-30 NOTE — ED Notes (Signed)
Pt. Stated, I had asthma last night and I thought I had grown out of it.I use to have a inhaler.

## 2013-10-30 NOTE — ED Provider Notes (Signed)
CSN: [id number]     Arrival date & time 10/30/13  [id number] History   First MD Initiated Contact with Patient 10/30/13 [phone]     Chief Complaint  Patient presents with  . Shortness of Breath  . Cough     (Consider location/radiation/quality/duration/timing/severity/associated sxs/prior Treatment) Patient is a 28 y.o. male presenting with shortness of breath and cough. The history is provided by the patient.  Shortness of Breath Associated symptoms: cough   Associated symptoms: no abdominal pain, no fever, no headaches, no neck pain, no rash, no sore throat and no vomiting   Cough Associated symptoms: shortness of breath   Associated symptoms: no chills, no fever, no headaches, no rash and no sore throat   pt with hx bronchitis, occasional wheezing episodes in past although no regular mdi use, c/o feeling sob, tight/wheezy in chest in past day. Discomfort mid chest, non radiating, at rest, constant, and not related to either positional change or activity level. No other recent cp or discomfort of any sort even w exertion/exercise. No associated nv, diaphoresis. No fever or chills. Also in past day, non productive cough. No sore throat or runny nose. No pleuritic pain. No leg pain or swelling. No recent travel, immobility, surgery, or trauma. No hx chd, cad, or dvt/pe. No fam hx cad. No cocaine/drug use.     History reviewed. No pertinent past medical history. History reviewed. No pertinent past surgical history. No family history on file. History  Substance Use Topics  . Smoking status: Former Games developermoker  . Smokeless tobacco: Not on file  . Alcohol Use: No    Review of Systems  Constitutional: Negative for fever and chills.  HENT: Negative for sore throat.   Eyes: Negative for redness.  Respiratory: Positive for cough and shortness of breath.   Cardiovascular: Negative for palpitations and leg swelling.  Gastrointestinal: Negative for nausea, vomiting and abdominal pain.  Genitourinary:  Negative for flank pain.  Musculoskeletal: Negative for back pain and neck pain.  Skin: Negative for rash.  Neurological: Negative for headaches.  Hematological: Does not bruise/bleed easily.  Psychiatric/Behavioral: Negative for confusion.      Allergies  Review of patient's allergies indicates no known allergies.  Home Medications   Prior to Admission medications   Not on File   BP 140/72  Pulse 77  Temp(Src) 98.3 F (36.8 C) (Oral)  Resp 16  Ht 5\' 7"  (1.702 m)  Wt 220 lb (99.791 kg)  BMI 34.45 kg/m2  SpO2 100% Physical Exam  Nursing note and vitals reviewed. Constitutional: He is oriented to person, place, and time. He appears well-developed and well-nourished. No distress.  HENT:  Mouth/Throat: Oropharynx is clear and moist.  Eyes: Conjunctivae are normal. No scleral icterus.  Neck: Neck supple. No JVD present. No tracheal deviation present.  Cardiovascular: Normal rate, regular rhythm, normal heart sounds and intact distal pulses.  Exam reveals no gallop and no friction rub.   No murmur heard. Pulmonary/Chest: Effort normal and breath sounds normal. No accessory muscle usage. No respiratory distress. He exhibits tenderness.  Abdominal: Soft. Bowel sounds are normal. He exhibits no distension and no mass. There is no tenderness. There is no rebound and no guarding.  Musculoskeletal: Normal range of motion. He exhibits no edema and no tenderness.  Neurological: He is alert and oriented to person, place, and time.  Skin: Skin is warm and dry. He is not diaphoretic.  Psychiatric: He has a normal mood and affect.    ED Course  Procedures (  including critical care time) Labs Review Results for orders placed during the hospital encounter of 10/30/13  TROPONIN I      Result Value Ref Range   Troponin I <0.30  <0.30 ng/mL   Dg Chest 2 View  10/30/2013   CLINICAL DATA:  Shortness of breath.  EXAM: CHEST  2 VIEW  COMPARISON:  01/13/2013.  FINDINGS: Mediastinum and hilar  structures normal the lungs are clear. Heart size is normal. No focal pulmonary infiltrate. No pleural effusion or pneumothorax.  IMPRESSION: No acute cardiopulmonary disease.   Electronically Signed   By: Maisie Fus  Register   On: 10/30/2013 09:33      EKG Interpretation   Date/Time:  Monday October 30 2013 09:00:22 EDT Ventricular Rate:  76 PR Interval:  144 QRS Duration: 81 QT Interval:  368 QTC Calculation: 414 R Axis:   61 Text Interpretation:  Sinus rhythm Normal ECG No significant change since  last tracing Confirmed by Denton Lank  MD, Caryn Bee ([id number]) on 10/30/2013 9:05:49  AM      MDM  Cxr. Lab.  Reviewed nursing notes and prior charts for additional history.   After symptoms x 1 day, trop neg.   Pt breathing comfortably, no increased wob.   Pt appears stable for d/c.    Suzi Roots, MD 10/30/13 [phone]

## 2013-10-30 NOTE — ED Notes (Signed)
Running out of breath since last night and cp  Hurts to cough had inhaler 1 1/2 years ago but does not have it anymore

## 2013-10-30 NOTE — Discharge Instructions (Signed)
You may use albuterol inhaler as need. Try mucinex or robitussin as need for cough. Follow up with primary care doctor in the next 1-2 weeks. Return to ER if worse, new symptoms, fevers, trouble breathing, persistent/recurrent chest pain, other concern.   Cough, Adult  A cough is a reflex that helps clear your throat and airways. It can help heal the body or may be a reaction to an irritated airway. A cough may only last 2 or 3 weeks (acute) or may last more than 8 weeks (chronic).  CAUSES Acute cough:  Viral or bacterial infections. Chronic cough:  Infections.  Allergies.  Asthma.  Post-nasal drip.  Smoking.  Heartburn or acid reflux.  Some medicines.  Chronic lung problems (COPD).  Cancer. SYMPTOMS   Cough.  Fever.  Chest pain.  Increased breathing rate.  High-pitched whistling sound when breathing (wheezing).  Colored mucus that you cough up (sputum). TREATMENT   A bacterial cough may be treated with antibiotic medicine.  A viral cough must run its course and will not respond to antibiotics.  Your caregiver may recommend other treatments if you have a chronic cough. HOME CARE INSTRUCTIONS   Only take over-the-counter or prescription medicines for pain, discomfort, or fever as directed by your caregiver. Use cough suppressants only as directed by your caregiver.  Use a cold steam vaporizer or humidifier in your bedroom or home to help loosen secretions.  Sleep in a semi-upright position if your cough is worse at night.  Rest as needed.  Stop smoking if you smoke. SEEK IMMEDIATE MEDICAL CARE IF:   You have pus in your sputum.  Your cough starts to worsen.  You cannot control your cough with suppressants and are losing sleep.  You begin coughing up blood.  You have difficulty breathing.  You develop pain which is getting worse or is uncontrolled with medicine.  You have a fever. MAKE SURE YOU:   Understand these instructions.  Will  watch your condition.  Will get help right away if you are not doing well or get worse. Document Released: 09/19/2010 Document Revised: 06/15/2011 Document Reviewed: 09/19/2010 [facility] Patient Information 2015 [place], [place]. This information is not intended to replace advice given to you by your health care provider. Make sure you discuss any questions you have with your health care provider.   Asthma Attack Prevention Although there is no way to prevent asthma from starting, you can take steps to control the disease and reduce its symptoms. Learn about your asthma and how to control it. Take an active role to control your asthma by working with your health care provider to create and follow an asthma action plan. An asthma action plan guides you in:  Taking your medicines properly.  Avoiding things that set off your asthma or make your asthma worse (asthma triggers).  Tracking your level of asthma control.  Responding to worsening asthma.  Seeking emergency care when needed. To track your asthma, keep records of your symptoms, check your peak flow number using a handheld device that shows how well air moves out of your lungs (peak flow meter), and get regular asthma checkups.  WHAT ARE SOME WAYS TO PREVENT AN ASTHMA ATTACK?  Take medicines as directed by your health care provider.  Keep track of your asthma symptoms and level of control.  With your health care provider, write a detailed plan for taking medicines and managing an asthma attack. Then be sure to follow your action plan. Asthma is an ongoing condition  that needs regular monitoring and treatment.  Identify and avoid asthma triggers. Many outdoor allergens and irritants (such as pollen, mold, cold air, and air pollution) can trigger asthma attacks. Find out what your asthma triggers are and take steps to avoid them.  Monitor your breathing. Learn to recognize warning signs of an attack, such as coughing, wheezing, or  shortness of breath. Your lung function may decrease before you notice any signs or symptoms, so regularly measure and record your peak airflow with a home peak flow meter.  Identify and treat attacks early. If you act quickly, you are less likely to have a severe attack. You will also need less medicine to control your symptoms. When your peak flow measurements decrease and alert you to an upcoming attack, take your medicine as instructed and immediately stop any activity that may have triggered the attack. If your symptoms do not improve, get medical help.  Pay attention to increasing quick-relief inhaler use. If you find yourself relying on your quick-relief inhaler, your asthma is not under control. See your health care provider about adjusting your treatment. WHAT CAN MAKE MY SYMPTOMS WORSE? A number of common things can set off or make your asthma symptoms worse and cause temporary increased inflammation of your airways. Keep track of your asthma symptoms for several weeks, detailing all the environmental and emotional factors that are linked with your asthma. When you have an asthma attack, go back to your asthma diary to see which factor, or combination of factors, might have contributed to it. Once you know what these factors are, you can take steps to control many of them. If you have allergies and asthma, it is important to take asthma prevention steps at home. Minimizing contact with the substance to which you are allergic will help prevent an asthma attack. Some triggers and ways to avoid these triggers are: Animal Dander:  Some people are allergic to the flakes of skin or dried saliva from animals with fur or feathers.   There is no such thing as a hypoallergenic dog or cat breed. All dogs or cats can cause allergies, even if they don't shed.  Keep these pets out of your home.  If you are not able to keep a pet outdoors, keep the pet out of your bedroom and other sleeping areas at all  times, and keep the door closed.  Remove carpets and furniture covered with cloth from your home. If that is not possible, keep the pet away from fabric-covered furniture and carpets. Dust Mites: Many people with asthma are allergic to dust mites. Dust mites are tiny bugs that are found in every home in mattresses, pillows, carpets, fabric-covered furniture, bedcovers, clothes, stuffed toys, and other fabric-covered items.   Cover your mattress in a special dust-proof cover.  Cover your pillow in a special dust-proof cover, or wash the pillow each week in hot water. Water must be hotter than 130 F (54.4 C) to kill dust mites. Cold or warm water used with detergent and bleach can also be effective.  Wash the sheets and blankets on your bed each week in hot water.  Try not to sleep or lie on cloth-covered cushions.  Call ahead when traveling and ask for a smoke-free hotel room. Bring your own bedding and pillows in case the hotel only supplies feather pillows and down comforters, which may contain dust mites and cause asthma symptoms.  Remove carpets from your bedroom and those laid on concrete, if you can.  Keep  stuffed toys out of the bed, or wash the toys weekly in hot water or cooler water with detergent and bleach. Cockroaches: Many people with asthma are allergic to the droppings and remains of cockroaches.   Keep food and garbage in closed containers. Never leave food out.  Use poison baits, traps, powders, gels, or paste (for example, boric acid).  If a spray is used to kill cockroaches, stay out of the room until the odor goes away. Indoor Mold:  Fix leaky faucets, pipes, or other sources of water that have mold around them.  Clean floors and moldy surfaces with a fungicide or diluted bleach.  Avoid using humidifiers, vaporizers, or swamp coolers. These can spread molds through the air. Pollen and Outdoor Mold:  When pollen or mold spore counts are high, try to keep your  windows closed.  Stay indoors with windows closed from late morning to afternoon. Pollen and some mold spore counts are highest at that time.  Ask your health care provider whether you need to take anti-inflammatory medicine or increase your dose of the medicine before your allergy season starts. Other Irritants to Avoid:  Tobacco smoke is an irritant. If you smoke, ask your health care provider how you can quit. Ask family members to quit smoking, too. Do not allow smoking in your home or car.  If possible, do not use a wood-burning stove, kerosene heater, or fireplace. Minimize exposure to all sources of smoke, including incense, candles, fires, and fireworks.  Try to stay away from strong odors and sprays, such as perfume, talcum powder, hair spray, and paints.  Decrease humidity in your home and use an indoor air cleaning device. Reduce indoor humidity to below 60%. Dehumidifiers or central air conditioners can do this.  Decrease house dust exposure by changing furnace and air cooler filters frequently.  Try to have someone else vacuum for you once or twice a week. Stay out of rooms while they are being vacuumed and for a short while afterward.  If you vacuum, use a dust mask from a hardware store, a double-layered or microfilter vacuum cleaner bag, or a vacuum cleaner with a HEPA filter.  Sulfites in foods and beverages can be irritants. Do not drink beer or wine or eat dried fruit, processed potatoes, or shrimp if they cause asthma symptoms.  Cold air can trigger an asthma attack. Cover your nose and mouth with a scarf on cold or windy days.  Several health conditions can make asthma more difficult to manage, including a runny nose, sinus infections, reflux disease, psychological stress, and sleep apnea. Work with your health care provider to manage these conditions.  Avoid close contact with people who have a respiratory infection such as a cold or the flu, since your asthma  symptoms may get worse if you catch the infection. Wash your hands thoroughly after touching items that may have been handled by people with a respiratory infection.  Get a flu shot every year to protect against the flu virus, which often makes asthma worse for days or weeks. Also get a pneumonia shot if you have not previously had one. Unlike the flu shot, the pneumonia shot does not need to be given yearly. Medicines:  Talk to your health care provider about whether it is safe for you to take aspirin or non-steroidal anti-inflammatory medicines (NSAIDs). In a small number of people with asthma, aspirin and NSAIDs can cause asthma attacks. These medicines must be avoided by people who have known aspirin-sensitive asthma.  It is important that people with aspirin-sensitive asthma read labels of all over-the-counter medicines used to treat pain, colds, coughs, and fever.  Beta-blockers and ACE inhibitors are other medicines you should discuss with your health care provider. HOW CAN I FIND OUT WHAT I AM ALLERGIC TO? Ask your asthma health care provider about allergy skin testing or blood testing (the RAST test) to identify the allergens to which you are sensitive. If you are found to have allergies, the most important thing to do is to try to avoid exposure to any allergens that you are sensitive to as much as possible. Other treatments for allergies, such as medicines and allergy shots (immunotherapy) are available.  CAN I EXERCISE? Follow your health care provider's advice regarding asthma treatment before exercising. It is important to maintain a regular exercise program, but vigorous exercise or exercise in cold, humid, or dry environments can cause asthma attacks, especially for those people who have exercise-induced asthma. Document Released: 03/11/2009 Document Revised: 03/28/2013 Document Reviewed: 09/28/2012 [facility] Patient Information 2015 [place], [place]. This information is not intended to  replace advice given to you by your health care provider. Make sure you discuss any questions you have with your health care provider.     Chest Pain (Nonspecific) It is often hard to give a diagnosis for the cause of chest pain. There is always a chance that your pain could be related to something serious, such as a heart attack or a blood clot in the lungs. You need to follow up with your doctor. HOME CARE  If antibiotic medicine was given, take it as directed by your doctor. Finish the medicine even if you start to feel better.  For the next few days, avoid activities that bring on chest pain. Continue physical activities as told by your doctor.  Do not use any tobacco products. This includes cigarettes, chewing tobacco, and e-cigarettes.  Avoid drinking alcohol.  Only take medicine as told by your doctor.  Follow your doctor's suggestions for more testing if your chest pain does not go away.  Keep all doctor visits you made. GET HELP IF:  Your chest pain does not go away, even after treatment.  You have a rash with blisters on your chest.  You have a fever. GET HELP RIGHT AWAY IF:   You have more pain or pain that spreads to your arm, neck, jaw, back, or belly (abdomen).  You have shortness of breath.  You cough more than usual or cough up blood.  You have very bad back or belly pain.  You feel sick to your stomach (nauseous) or throw up (vomit).  You have very bad weakness.  You pass out (faint).  You have chills. This is an emergency. Do not wait to see if the problems will go away. Call your local emergency services (911 in [place] Do not drive yourself to the hospital. MAKE SURE YOU:   Understand these instructions.  Will watch your condition.  Will get help right away if you are not doing well or get worse. Document Released: 09/09/2007 Document Revised: 03/28/2013 Document Reviewed: 09/09/2007 [facility] Patient Information 2015 [place], [place]. This  information is not intended to replace advice given to you by your health care provider. Make sure you discuss any questions you have with your health care provider.

## 2019-01-14 ENCOUNTER — Encounter (HOSPITAL_COMMUNITY): Payer: Self-pay | Admitting: Emergency Medicine

## 2019-01-14 ENCOUNTER — Emergency Department (HOSPITAL_COMMUNITY)
Admission: EM | Admit: 2019-01-14 | Discharge: 2019-01-15 | Disposition: A | Discharge status: Home / Self Care | Payer: Self-pay | Attending: Emergency Medicine | Admitting: Emergency Medicine

## 2019-01-14 ENCOUNTER — Other Ambulatory Visit: Payer: Self-pay

## 2019-01-14 DIAGNOSIS — R631 Polydipsia: Secondary | ICD-10-CM | POA: Insufficient documentation

## 2019-01-14 DIAGNOSIS — R42 Dizziness and giddiness: Secondary | ICD-10-CM | POA: Insufficient documentation

## 2019-01-14 DIAGNOSIS — Z87891 Personal history of nicotine dependence: Secondary | ICD-10-CM | POA: Insufficient documentation

## 2019-01-14 DIAGNOSIS — R110 Nausea: Secondary | ICD-10-CM | POA: Insufficient documentation

## 2019-01-14 DIAGNOSIS — R350 Frequency of micturition: Secondary | ICD-10-CM | POA: Insufficient documentation

## 2019-01-14 DIAGNOSIS — R82998 Other abnormal findings in urine: Secondary | ICD-10-CM | POA: Insufficient documentation

## 2019-01-14 DIAGNOSIS — R1012 Left upper quadrant pain: Secondary | ICD-10-CM | POA: Insufficient documentation

## 2019-01-14 DIAGNOSIS — R0602 Shortness of breath: Secondary | ICD-10-CM | POA: Insufficient documentation

## 2019-01-14 DIAGNOSIS — R05 Cough: Secondary | ICD-10-CM | POA: Insufficient documentation

## 2019-01-14 DIAGNOSIS — R5383 Other fatigue: Secondary | ICD-10-CM | POA: Insufficient documentation

## 2019-01-14 LAB — COMPREHENSIVE METABOLIC PANEL
ALT: 37 U/L (ref 0–44)
AST: 22 U/L (ref 15–41)
Albumin: 3.9 g/dL (ref 3.5–5.0)
Alkaline Phosphatase: 55 U/L (ref 38–126)
Anion gap: 9 (ref 5–15)
BUN: 9 mg/dL (ref 6–20)
CO2: 26 mmol/L (ref 22–32)
Calcium: 9.1 mg/dL (ref 8.9–10.3)
Chloride: 101 mmol/L (ref 98–111)
Creatinine, Ser: 0.97 mg/dL (ref 0.61–1.24)
GFR calc Af Amer: >60 mL/min (ref >60)
GFR calc non Af Amer: >60 mL/min (ref >60)
Glucose, Bld: 105 mg/dL — ABNORMAL HIGH (ref 70–99)
Potassium: 3.6 mmol/L (ref 3.5–5.1)
Sodium: 136 mmol/L (ref 135–145)
Total Bilirubin: 0.4 mg/dL (ref 0.3–1.2)
Total Protein: 7 g/dL (ref 6.5–8.1)

## 2019-01-14 LAB — CBC WITH DIFFERENTIAL/PLATELET
Abs Immature Granulocytes: 0.05 10*3/uL (ref 0.00–0.07)
Basophils Absolute: 0.1 10*3/uL (ref 0.0–0.1)
Basophils Relative: 1 %
Eosinophils Absolute: 0.2 10*3/uL (ref 0.0–0.5)
Eosinophils Relative: 2 %
HCT: 45.7 % (ref 39.0–52.0)
Hemoglobin: 15.6 g/dL (ref 13.0–17.0)
Immature Granulocytes: 1 %
Lymphocytes Relative: 15 %
Lymphs Abs: 1.6 10*3/uL (ref 0.7–4.0)
MCH: 30.5 pg (ref 26.0–34.0)
MCHC: 34.1 g/dL (ref 30.0–36.0)
MCV: 89.4 fL (ref 80.0–100.0)
Monocytes Absolute: 1.1 10*3/uL — ABNORMAL HIGH (ref 0.1–1.0)
Monocytes Relative: 11 %
Neutro Abs: 7.3 10*3/uL (ref 1.7–7.7)
Neutrophils Relative %: 70 %
Platelets: 289 10*3/uL (ref 150–400)
RBC: 5.11 MIL/uL (ref 4.22–5.81)
RDW: 12.7 % (ref 11.5–15.5)
WBC: 10.3 10*3/uL (ref 4.0–10.5)
nRBC: 0 % (ref 0.0–0.2)

## 2019-01-14 LAB — URINALYSIS, ROUTINE W REFLEX MICROSCOPIC
Bilirubin Urine: NEGATIVE
Glucose, UA: NEGATIVE mg/dL
Hgb urine dipstick: NEGATIVE
Ketones, ur: NEGATIVE mg/dL
Leukocytes,Ua: NEGATIVE
Nitrite: NEGATIVE
Protein, ur: NEGATIVE mg/dL
Specific Gravity, Urine: 1.021 (ref 1.005–1.030)
pH: 6 (ref 5.0–8.0)

## 2019-01-14 NOTE — ED Triage Notes (Signed)
Pt c/o increase fatigue and leg shaking. Pt states he use to have this symptoms when he is very dihydrated. Denies fevers or chills.

## 2019-01-15 ENCOUNTER — Emergency Department (HOSPITAL_COMMUNITY): Payer: Self-pay

## 2019-01-15 LAB — MAGNESIUM: Magnesium: 1.9 mg/dL (ref 1.7–2.4)

## 2019-01-15 LAB — LIPASE, BLOOD: Lipase: 30 U/L (ref 11–51)

## 2019-01-15 MED ORDER — ONDANSETRON HCL 4 MG/2ML IJ SOLN
4.0000 mg | Freq: Once | INTRAMUSCULAR | Status: AC
  Administered 2019-01-15: 4 mg via INTRAVENOUS
  Filled 2019-01-15: qty 2

## 2019-01-15 MED ORDER — KETOROLAC TROMETHAMINE 30 MG/ML IJ SOLN
15.0000 mg | Freq: Once | INTRAMUSCULAR | Status: AC
  Administered 2019-01-15: 05:00:00 15 mg via INTRAVENOUS
  Filled 2019-01-15: qty 1

## 2019-01-15 MED ORDER — FAMOTIDINE 20 MG PO TABS: 20.0000 mg | ORAL_TABLET | Freq: Two times a day (BID) | ORAL | 0 refills | Status: DC

## 2019-01-15 MED ORDER — LACTATED RINGERS IV BOLUS
1000.0000 mL | Freq: Once | INTRAVENOUS | Status: AC
  Administered 2019-01-15: 1000 mL via INTRAVENOUS

## 2019-01-15 MED ORDER — PANTOPRAZOLE SODIUM 20 MG PO TBEC: 20.0000 mg | DELAYED_RELEASE_TABLET | Freq: Every day | ORAL | 0 refills | Status: DC

## 2019-01-15 MED ORDER — LIDOCAINE VISCOUS HCL 2 % MT SOLN
15.0000 mL | Freq: Once | OROMUCOSAL | Status: AC
  Administered 2019-01-15: 05:00:00 15 mL via OROMUCOSAL
  Filled 2019-01-15: qty 15

## 2019-01-15 NOTE — ED Provider Notes (Signed)
Emergency Department Provider Note   I have reviewed the triage vital signs and the nursing notes.   HISTORY  Chief Complaint Fatigue   HPI Raymond Knox is a 33 y.o. male who presents the emergency department today secondary to concerns for dehydration.  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  He feels very thirsty all the time.  He feels very lightheaded when he moves around.  He has some nonspecific central abdominal pain as well.  Patient states he try drink much water but has not started sweating like he would expect.  He has had some decrease in his urination and still but darker than normal.  Some nausea no vomiting.  No diarrhea or constipation.  No fevers.  He does state he has a mild dry cough and a little bit of shortness of breath when he coughs.  No other associated symptoms.  No sick contacts.   No other associated or modifying symptoms.    History reviewed. No pertinent past medical history.  There are no active problems to display for this patient.   History reviewed. No pertinent surgical history.  Current Outpatient Rx  . Order #: [id number] Class: Print  . Order #: [id number] Class: Print  . Order #: [id number] Class: Print    Allergies Patient has no known allergies.  No family history on file.  Social History Social History   Tobacco Use  . Smoking status: Former Smoker  Substance Use Topics  . Alcohol use: No  . Drug use: No    Review of Systems  All other systems negative except as documented in the HPI. All pertinent positives and negatives as reviewed in the HPI. ____________________________________________   PHYSICAL EXAM:  VITAL SIGNS: ED Triage Vitals  Enc Vitals Group     BP 01/14/19 2046 (!) 155/91     Pulse Rate 01/14/19 2045 (!) 101     Resp 01/14/19 2045 20     Temp 01/14/19 2045 99.7 F (37.6 C)      Temp Source 01/14/19 2045 Oral     SpO2 01/14/19 2045 97 %     Weight 01/14/19 2046 230 lb (104.3 kg)     Height 01/14/19 2046 5\' 7"  (1.702 m)    Constitutional: Alert and oriented. Well appearing and in no acute distress. Eyes: Conjunctivae are normal. PERRL. EOMI. Head: Atraumatic. Nose: No congestion/rhinnorhea. Mouth/Throat: Mucous membranes are moist.  Oropharynx non-erythematous. Neck: No stridor.  No meningeal signs.   Cardiovascular: tachycardic rate, regular rhythm. Good peripheral circulation. Grossly normal heart sounds.   Respiratory: Normal respiratory effort.  No retractions. Lungs CTAB. Gastrointestinal: Soft and nontender. No distention.  Musculoskeletal: No lower extremity tenderness nor edema. No gross deformities of extremities. Neurologic:  Normal speech and language. No gross focal neurologic deficits are appreciated.  Skin:  Skin is warm, dry and intact. No rash noted.   ____________________________________________   LABS (all labs ordered are listed, but only abnormal results are displayed)  Labs Reviewed  CBC WITH DIFFERENTIAL/PLATELET - Abnormal; Notable for the following components:      Result Value   Monocytes Absolute 1.1 (*)    All other components within normal limits  COMPREHENSIVE METABOLIC PANEL - Abnormal; Notable for the following components:   Glucose, Bld 105 (*)    All other components within normal limits  URINALYSIS, ROUTINE W  REFLEX MICROSCOPIC  MAGNESIUM  LIPASE, BLOOD   ____________________________________________  EKG   EKG Interpretation  Date/Time:  Sunday January 15 2019 03:00:19 EDT Ventricular Rate:  86 PR Interval:    QRS Duration: 83 QT Interval:  350 QTC Calculation: 419 R Axis:   96 Text Interpretation:  Sinus rhythm Consider left atrial enlargement Borderline right axis deviation Confirmed by Marily Memos [phone]) on 01/15/2019 4:57:24 AM       ____________________________________________  RADIOLOGY   Dg Chest 2 View  Result Date: 01/15/2019 CLINICAL DATA:  Evaluate for cough, shortness of breath. EXAM: CHEST - 2 VIEW COMPARISON:  Radiograph 10/30/2013 FINDINGS: Low lung volumes with streaky areas of atelectasis in the bases and some central vascular crowding. No consolidation, features of edema, pneumothorax, or effusion. Pulmonary vascularity is normally distributed. The cardiomediastinal contours are unremarkable. No acute osseous or soft tissue abnormality. IMPRESSION: Atelectasis, otherwise no acute cardiopulmonary process. Electronically Signed   By: Kreg Shropshire M.D.   On: 01/15/2019 02:24    ____________________________________________  INITIAL IMPRESSION / ASSESSMENT AND PLAN / ED COURSE  Add on ECG, CXR, magnesium. Will give some fluids. Add on lipase.  Somewhat improving symptoms. Likely gastritis. Tolerating PO. Dizziness improved. Urine clear. Appears well.   Pertinent labs & imaging results that were available during my care of the patient were reviewed by me and considered in my medical decision making (see chart for details).  A medical screening exam was performed and I feel the patient has had an appropriate workup for their chief complaint at this time and likelihood of emergent condition existing is low. They have been counseled on decision, discharge, follow up and which symptoms necessitate immediate return to the emergency department. They or their family verbally stated understanding and agreement with plan and discharged in stable condition.   ____________________________________________  FINAL CLINICAL IMPRESSION(S) / ED DIAGNOSES  Final diagnoses:  Fatigue, unspecified type  Left upper quadrant abdominal pain     MEDICATIONS GIVEN DURING THIS VISIT:  Medications  lactated ringers bolus 1,000 mL (0 mLs Intravenous Stopped 01/15/19 0330)  ondansetron (ZOFRAN) injection 4 mg (4 mg Intravenous Given 01/15/19 0233)  lidocaine (XYLOCAINE) 2 % viscous mouth  solution 15 mL (15 mLs Mouth/Throat Given 01/15/19 0515)  ketorolac (TORADOL) 30 MG/ML injection 15 mg (15 mg Intravenous Given 01/15/19 0514)     NEW OUTPATIENT MEDICATIONS STARTED DURING THIS VISIT:  New Prescriptions   FAMOTIDINE (PEPCID) 20 MG TABLET    Take 1 tablet (20 mg total) by mouth 2 (two) times daily.   PANTOPRAZOLE (PROTONIX) 20 MG TABLET    Take 1 tablet (20 mg total) by mouth daily.    Note:  This note was prepared with assistance of Dragon voice recognition software. Occasional wrong-word or sound-a-like substitutions may have occurred due to the inherent limitations of voice recognition software.   [place] Behlke, Barbara Cower, MD 01/15/19 [phone]

## 2019-01-17 ENCOUNTER — Other Ambulatory Visit: Payer: Self-pay

## 2019-01-17 ENCOUNTER — Encounter (HOSPITAL_COMMUNITY): Payer: Self-pay | Admitting: Emergency Medicine

## 2019-01-17 ENCOUNTER — Emergency Department (HOSPITAL_COMMUNITY): Payer: Self-pay

## 2019-01-17 ENCOUNTER — Emergency Department (HOSPITAL_COMMUNITY)
Admission: EM | Admit: 2019-01-17 | Discharge: 2019-01-17 | Disposition: A | Discharge status: Home / Self Care | Payer: Self-pay | Attending: Emergency Medicine | Admitting: Emergency Medicine

## 2019-01-17 DIAGNOSIS — R140 Abdominal distension (gaseous): Secondary | ICD-10-CM | POA: Insufficient documentation

## 2019-01-17 DIAGNOSIS — Z87891 Personal history of nicotine dependence: Secondary | ICD-10-CM | POA: Insufficient documentation

## 2019-01-17 LAB — CBC
HCT: 48.6 % (ref 39.0–52.0)
Hemoglobin: 16 g/dL (ref 13.0–17.0)
MCH: 29.9 pg (ref 26.0–34.0)
MCHC: 32.9 g/dL (ref 30.0–36.0)
MCV: 90.8 fL (ref 80.0–100.0)
Platelets: 255 10*3/uL (ref 150–400)
RBC: 5.35 MIL/uL (ref 4.22–5.81)
RDW: 12.9 % (ref 11.5–15.5)
WBC: 5.7 10*3/uL (ref 4.0–10.5)
nRBC: 0 % (ref 0.0–0.2)

## 2019-01-17 LAB — BASIC METABOLIC PANEL
Anion gap: 13 (ref 5–15)
BUN: 10 mg/dL (ref 6–20)
CO2: 21 mmol/L — ABNORMAL LOW (ref 22–32)
Calcium: 8.9 mg/dL (ref 8.9–10.3)
Chloride: 102 mmol/L (ref 98–111)
Creatinine, Ser: 0.99 mg/dL (ref 0.61–1.24)
GFR calc Af Amer: >60 mL/min (ref >60)
GFR calc non Af Amer: >60 mL/min (ref >60)
Glucose, Bld: 110 mg/dL — ABNORMAL HIGH (ref 70–99)
Potassium: 3.8 mmol/L (ref 3.5–5.1)
Sodium: 136 mmol/L (ref 135–145)

## 2019-01-17 LAB — TROPONIN I (HIGH SENSITIVITY): Troponin I (High Sensitivity): 4 ng/L (ref <18)

## 2019-01-17 MED ORDER — LIDOCAINE VISCOUS HCL 2 % MT SOLN
15.0000 mL | Freq: Once | OROMUCOSAL | Status: AC
  Administered 2019-01-17: 15 mL via ORAL
  Filled 2019-01-17: qty 15

## 2019-01-17 MED ORDER — POLYETHYLENE GLYCOL 3350 17 G PO PACK: 17.0000 g | PACK | Freq: Every day | ORAL | 0 refills | Status: DC

## 2019-01-17 MED ORDER — ALUM & MAG HYDROXIDE-SIMETH 200-200-20 MG/5ML PO SUSP
30.0000 mL | Freq: Once | ORAL | Status: AC
  Administered 2019-01-17: 19:00:00 30 mL via ORAL
  Filled 2019-01-17: qty 30

## 2019-01-17 MED ORDER — SUCRALFATE 1 GM/10ML PO SUSP: 1.0000 g | Freq: Four times a day (QID) | ORAL | 0 refills | Status: DC

## 2019-01-17 MED ORDER — SUCRALFATE 1 GM/10ML PO SUSP
1.0000 g | Freq: Three times a day (TID) | ORAL | Status: DC
  Filled 2019-01-17 (×2): qty 10

## 2019-01-17 MED ORDER — SODIUM CHLORIDE 0.9% FLUSH: 3.0000 mL | Freq: Once | INTRAVENOUS | Status: DC

## 2019-01-17 NOTE — ED Provider Notes (Signed)
[facility] EMERGENCY DEPARTMENT Provider Note   CSN: [id number] Arrival date & time: 01/17/19  1121     History   Chief Complaint Chief Complaint  Patient presents with  . Dizziness  . Chest Pain  . Shortness of Breath    HPI Raymond Knox is a 33 y.o. male.     The history is provided by the patient. No language interpreter was used.  Dizziness Quality:  Lightheadedness Severity:  Moderate Onset quality:  Gradual Duration:  1 day Timing:  Sporadic Progression:  Resolved Relieved by:  Nothing Ineffective treatments:  None tried Associated symptoms: chest pain and shortness of breath   Chest Pain Associated symptoms: dizziness and shortness of breath   Shortness of Breath Associated symptoms: chest pain    Pt reports he was seen here yesterday and given a prescription for 2 medications.  Pt reports when he took them today he felt increased abdominal gas and bloating.  Pt reports he did not have chest pain but has been belching frequently and feels like he  Still has a lot of gas.  Pt denies fever, chills or cough  History reviewed. No pertinent past medical history.  There are no active problems to display for this patient.   History reviewed. No pertinent surgical history.      Home Medications    Prior to Admission medications   Medication Sig Start Date End Date Taking? Authorizing Provider  albuterol (PROVENTIL HFA;VENTOLIN HFA) 108 (90 BASE) MCG/ACT inhaler Inhale 2 puffs into the lungs every 4 (four) hours as needed for wheezing or shortness of breath. 10/30/13   Cathren Laine, MD  famotidine (PEPCID) 20 MG tablet Take 1 tablet (20 mg total) by mouth 2 (two) times daily. 01/15/19   Mesner, Barbara Cower, MD  pantoprazole (PROTONIX) 20 MG tablet Take 1 tablet (20 mg total) by mouth daily. 01/15/19   Mesner, Barbara Cower, MD  polyethylene glycol (MIRALAX / GLYCOLAX) 17 g packet Take 17 g by mouth daily. 01/17/19   Elson Areas, PA-C  sucralfate  (CARAFATE) 1 GM/10ML suspension Take 10 mLs (1 g total) by mouth 4 (four) times daily. 01/17/19 01/17/20  Elson Areas, PA-C    Family History History reviewed. No pertinent family history.  Social History Social History   Tobacco Use  . Smoking status: Former Smoker  Substance Use Topics  . Alcohol use: No  . Drug use: No     Allergies   Patient has no known allergies.   Review of Systems Review of Systems  Respiratory: Positive for shortness of breath.   Cardiovascular: Positive for chest pain.  Neurological: Positive for dizziness.  All other systems reviewed and are negative.    Physical Exam Updated Vital Signs BP 134/86   Pulse 85   Temp 98.6 F (37 C) (Oral)   Resp 19   Ht 5\' 7"  (1.702 m)   Wt 104.3 kg   SpO2 97%   BMI 36.02 kg/m   Physical Exam Vitals signs and nursing note reviewed.  HENT:     Head: Normocephalic.  Neck:     Musculoskeletal: Normal range of motion.  Cardiovascular:     Rate and Rhythm: Normal rate.     Heart sounds: Normal heart sounds.  Pulmonary:     Effort: Pulmonary effort is normal.     Breath sounds: Normal breath sounds. No decreased breath sounds, wheezing or rhonchi.  Musculoskeletal: Normal range of motion.  Skin:    General: Skin is warm.  Neurological:     General: No focal deficit present.     Mental Status: He is alert.  Psychiatric:        Mood and Affect: Mood normal.      ED Treatments / Results  Labs (all labs ordered are listed, but only abnormal results are displayed) Labs Reviewed  BASIC METABOLIC PANEL - Abnormal; Notable for the following components:      Result Value   CO2 21 (*)    Glucose, Bld 110 (*)    All other components within normal limits  CBC  TROPONIN I (HIGH SENSITIVITY)  TROPONIN I (HIGH SENSITIVITY)    EKG None  Radiology Dg Chest 2 View  Result Date: 01/17/2019 CLINICAL DATA:  Chest pain and shortness of breath for 3 days. Dizziness. EXAM: CHEST - 2 VIEW  COMPARISON:  01/15/2019 FINDINGS: Heart size is within normal limits. Low lung volumes are again seen. Mild bibasilar atelectasis is noted. No evidence of pulmonary consolidation or pleural effusion. IMPRESSION: Low lung volumes and mild bibasilar atelectasis. Electronically Signed   By: Danae OrleansJohn A Stahl M.D.   On: 01/17/2019 12:01   Dg Abdomen 1 View  Result Date: 01/17/2019 CLINICAL DATA:  Pt c/o epigastric abdominal pain and SOB x 4 days. No hx of GI problems or surgeries. Patient states he feels like there is air in his stomach. EXAM: ABDOMEN - 1 VIEW COMPARISON:  None. FINDINGS: There are no dilated loops of bowel to suggest obstruction. No supine evidence for free air. No evidence of gastric distention. No unexpected radiopaque foreign body. Lung bases are excluded from field of view. No acute finding in the visualized skeleton. IMPRESSION: Nonobstructive bowel gas pattern. No evidence of gastric distention. Electronically Signed   By: Emmaline KluverNancy  Ballantyne M.D.   On: 01/17/2019 19:44    Procedures Procedures (including critical care time)  Medications Ordered in ED Medications  sodium chloride flush (NS) 0.9 % injection 3 mL (has no administration in time range)  sucralfate (CARAFATE) 1 GM/10ML suspension 1 g (has no administration in time range)  alum & mag hydroxide-simeth (MAALOX/MYLANTA) 200-200-20 MG/5ML suspension 30 mL (30 mLs Oral Given 01/17/19 1849)    And  lidocaine (XYLOCAINE) 2 % viscous mouth solution 15 mL (15 mLs Oral Given 01/17/19 1849)     Initial Impression / Assessment and Plan / ED Course  I have reviewed the triage vital signs and the nursing notes.  Pertinent labs & imaging results that were available during my care of the patient were reviewed by me and considered in my medical decision making (see chart for details).  Clinical Course as of Jan 17 2119  Tue Jan 17, 2019  2119 Troponin I (High Sensitivity) [LS]    Clinical Course User Index [LS] Elson AreasSofia, Napoleon Monacelli K,  PA-C       MDM  Pt given gi cocktail.  Pt reports temporary relief.  KUb non acute.  I advised pt to stop medications that made him feel dizzy.  I will try him on miralax and carafate.  Pt has nonacute abdominal exam.  Pt has lfts and lipase which were normal yesterday.  Pt has no sign of acute abdomen.  I doubt cardiac etiology.    Final Clinical Impressions(s) / ED Diagnoses   Final diagnoses:  Abdominal bloating    ED Discharge Orders         Ordered    polyethylene glycol (MIRALAX / GLYCOLAX) 17 g packet  Daily     01/17/19 2027  sucralfate (CARAFATE) 1 GM/10ML suspension  4 times daily     01/17/19 2049        An After Visit Summary was printed and given to the patient.    Sidney Ace 01/17/19 2122    Quintella Reichert, MD 01/18/19 1229

## 2019-01-17 NOTE — ED Triage Notes (Signed)
Pt reports feeling dizziness, CP and SOB since discharged from the hospital Saturday. Seen here for fatigue. Denies hx heart dz. No acute distress noted. VSS.

## 2019-02-18 DIAGNOSIS — K219 Gastro-esophageal reflux disease without esophagitis: Secondary | ICD-10-CM | POA: Insufficient documentation

## 2019-05-12 DIAGNOSIS — Z20822 Contact with and (suspected) exposure to covid-19: Secondary | ICD-10-CM | POA: Diagnosis not present

## 2019-05-17 DIAGNOSIS — R1013 Epigastric pain: Secondary | ICD-10-CM | POA: Diagnosis not present

## 2019-05-17 DIAGNOSIS — K317 Polyp of stomach and duodenum: Secondary | ICD-10-CM | POA: Diagnosis not present

## 2019-05-17 DIAGNOSIS — K219 Gastro-esophageal reflux disease without esophagitis: Secondary | ICD-10-CM | POA: Diagnosis not present

## 2019-05-29 DIAGNOSIS — K5900 Constipation, unspecified: Secondary | ICD-10-CM | POA: Diagnosis not present

## 2019-05-29 DIAGNOSIS — R072 Precordial pain: Secondary | ICD-10-CM | POA: Diagnosis not present

## 2019-05-29 DIAGNOSIS — R110 Nausea: Secondary | ICD-10-CM | POA: Diagnosis not present

## 2019-05-29 DIAGNOSIS — K219 Gastro-esophageal reflux disease without esophagitis: Secondary | ICD-10-CM | POA: Diagnosis not present

## 2019-05-29 DIAGNOSIS — I498 Other specified cardiac arrhythmias: Secondary | ICD-10-CM | POA: Diagnosis not present

## 2019-05-30 DIAGNOSIS — R072 Precordial pain: Secondary | ICD-10-CM | POA: Diagnosis not present

## 2019-06-07 DIAGNOSIS — R1012 Left upper quadrant pain: Secondary | ICD-10-CM | POA: Diagnosis not present

## 2019-07-18 ENCOUNTER — Encounter (HOSPITAL_COMMUNITY): Payer: Self-pay | Admitting: Emergency Medicine

## 2019-07-18 ENCOUNTER — Emergency Department (HOSPITAL_COMMUNITY)
Admission: EM | Admit: 2019-07-18 | Discharge: 2019-07-18 | Disposition: A | Discharge status: Home / Self Care | Payer: Medicaid Other | Attending: Emergency Medicine | Admitting: Emergency Medicine

## 2019-07-18 ENCOUNTER — Other Ambulatory Visit: Payer: Self-pay

## 2019-07-18 ENCOUNTER — Emergency Department (HOSPITAL_COMMUNITY): Payer: Medicaid Other

## 2019-07-18 DIAGNOSIS — R1031 Right lower quadrant pain: Secondary | ICD-10-CM

## 2019-07-18 DIAGNOSIS — Z87891 Personal history of nicotine dependence: Secondary | ICD-10-CM | POA: Insufficient documentation

## 2019-07-18 DIAGNOSIS — R109 Unspecified abdominal pain: Secondary | ICD-10-CM | POA: Diagnosis not present

## 2019-07-18 LAB — COMPREHENSIVE METABOLIC PANEL
ALT: 27 U/L (ref 0–44)
AST: 16 U/L (ref 15–41)
Albumin: 4 g/dL (ref 3.5–5.0)
Alkaline Phosphatase: 59 U/L (ref 38–126)
Anion gap: 7 (ref 5–15)
BUN: 11 mg/dL (ref 6–20)
CO2: 30 mmol/L (ref 22–32)
Calcium: 9.3 mg/dL (ref 8.9–10.3)
Chloride: 103 mmol/L (ref 98–111)
Creatinine, Ser: 1.16 mg/dL (ref 0.61–1.24)
GFR calc Af Amer: >60 mL/min (ref >60)
GFR calc non Af Amer: >60 mL/min (ref >60)
Glucose, Bld: 107 mg/dL — ABNORMAL HIGH (ref 70–99)
Potassium: 4.3 mmol/L (ref 3.5–5.1)
Sodium: 140 mmol/L (ref 135–145)
Total Bilirubin: 1.1 mg/dL (ref 0.3–1.2)
Total Protein: 7.4 g/dL (ref 6.5–8.1)

## 2019-07-18 LAB — URINALYSIS, ROUTINE W REFLEX MICROSCOPIC
Bilirubin Urine: NEGATIVE
Glucose, UA: NEGATIVE mg/dL
Hgb urine dipstick: NEGATIVE
Ketones, ur: NEGATIVE mg/dL
Leukocytes,Ua: NEGATIVE
Nitrite: NEGATIVE
Protein, ur: 30 mg/dL — AB
Specific Gravity, Urine: 1.026 (ref 1.005–1.030)
pH: 6 (ref 5.0–8.0)

## 2019-07-18 LAB — CBC
HCT: 48.8 % (ref 39.0–52.0)
Hemoglobin: 16.4 g/dL (ref 13.0–17.0)
MCH: 29.8 pg (ref 26.0–34.0)
MCHC: 33.6 g/dL (ref 30.0–36.0)
MCV: 88.6 fL (ref 80.0–100.0)
Platelets: 314 10*3/uL (ref 150–400)
RBC: 5.51 MIL/uL (ref 4.22–5.81)
RDW: 12.7 % (ref 11.5–15.5)
WBC: 8.5 10*3/uL (ref 4.0–10.5)
nRBC: 0 % (ref 0.0–0.2)

## 2019-07-18 LAB — LIPASE, BLOOD: Lipase: 28 U/L (ref 11–51)

## 2019-07-18 MED ORDER — HYOSCYAMINE SULFATE 0.125 MG PO TBDP: 0.1250 mg | ORAL_TABLET | Freq: Four times a day (QID) | ORAL | 0 refills | Status: DC | PRN

## 2019-07-18 MED ORDER — IOHEXOL 300 MG/ML  SOLN
100.0000 mL | Freq: Once | INTRAMUSCULAR | Status: AC | PRN
  Administered 2019-07-18: 100 mL via INTRAVENOUS

## 2019-07-18 NOTE — ED Notes (Signed)
Patient verbalizes understanding of discharge instructions. Opportunity for questioning and answers were provided. Armband removed by staff, pt discharged from ED ambulatory.   

## 2019-07-18 NOTE — Discharge Instructions (Addendum)
1.  Try disintegrating hyoscyamine tablet for pain. 2.  Follow a low-fat diet. 3.  Continue to work with your doctor for ongoing pain.

## 2019-07-18 NOTE — ED Provider Notes (Signed)
[facility] EMERGENCY DEPARTMENT Provider Note   CSN: [id number] Arrival date & time: 07/18/19  1451     History Chief Complaint  Patient presents with  . Abdominal Pain    Raymond Knox is a 34 y.o. male.  HPI Patient reports he has had right-sided abdominal pain almost 6 months.  Is aching and sharp quality.  Oftentimes worse with movements.  Patient denies he gets vomiting or diarrhea.  Denies urinary symptoms of pain burning urgency.  Sometimes he feels it into his flank area.  He reports sometimes it feels really uncomfortable and feels make like he has to cough.  Patient reports he has had an upper endoscopy that did not show any problems.  He is also had an ultrasound.  It never really improved.    History reviewed. No pertinent past medical history.  There are no problems to display for this patient.   History reviewed. No pertinent surgical history.     No family history on file.  Social History   Tobacco Use  . Smoking status: Former Smoker  Substance Use Topics  . Alcohol use: No  . Drug use: No    Home Medications Prior to Admission medications   Medication Sig Start Date End Date Taking? Authorizing Provider  albuterol (PROVENTIL HFA;VENTOLIN HFA) 108 (90 BASE) MCG/ACT inhaler Inhale 2 puffs into the lungs every 4 (four) hours as needed for wheezing or shortness of breath. 10/30/13   Lajean Saver, MD  famotidine (PEPCID) 20 MG tablet Take 1 tablet (20 mg total) by mouth 2 (two) times daily. 01/15/19   Mesner, Corene Cornea, MD  hyoscyamine (ANASPAZ) 0.125 MG TBDP disintergrating tablet Place 1 tablet (0.125 mg total) under the tongue every 6 (six) hours as needed for cramping. 07/18/19   Charlesetta Shanks, MD  pantoprazole (PROTONIX) 20 MG tablet Take 1 tablet (20 mg total) by mouth daily. 01/15/19   Mesner, Corene Cornea, MD  polyethylene glycol (MIRALAX / GLYCOLAX) 17 g packet Take 17 g by mouth daily. 01/17/19   Fransico Meadow, PA-C  sucralfate  (CARAFATE) 1 GM/10ML suspension Take 10 mLs (1 g total) by mouth 4 (four) times daily. 01/17/19 01/17/20  Fransico Meadow, PA-C    Allergies    Patient has no known allergies.  Review of Systems   Review of Systems 10 Systems reviewed and are negative for acute change except as noted in the HPI.  Physical Exam Updated Vital Signs BP 138/82 (BP Location: Left Arm)   Pulse 94   Temp 98.2 F (36.8 C) (Oral)   Resp 17   Ht 5\' 7"  (1.702 m)   Wt 90.7 kg   SpO2 100%   BMI 31.32 kg/m   Physical Exam Constitutional:      Comments: Alert nontoxic clinically well in appearance.  Eyes:     Extraocular Movements: Extraocular movements intact.     Conjunctiva/sclera: Conjunctivae normal.  Cardiovascular:     Rate and Rhythm: Normal rate and regular rhythm.     Pulses: Normal pulses.     Heart sounds: Normal heart sounds.  Pulmonary:     Effort: Pulmonary effort is normal.     Breath sounds: Normal breath sounds.  Abdominal:     Comments: Abdomen soft.  Moderate discomfort to be palpation in the mid right lateral abdomen.  No appreciable mass or fullness.  No hepatosplenomegaly palpable.  Musculoskeletal:        General: No swelling or tenderness. Normal range of motion.  Right lower leg: No edema.     Left lower leg: No edema.  Skin:    General: Skin is warm and dry.  Neurological:     General: No focal deficit present.     Mental Status: He is oriented to person, place, and time.     Coordination: Coordination normal.  Psychiatric:        Mood and Affect: Mood normal.     ED Results / Procedures / Treatments   Labs (all labs ordered are listed, but only abnormal results are displayed) Labs Reviewed  COMPREHENSIVE METABOLIC PANEL - Abnormal; Notable for the following components:      Result Value   Glucose, Bld 107 (*)    All other components within normal limits  URINALYSIS, ROUTINE W REFLEX MICROSCOPIC - Abnormal; Notable for the following components:   Protein,  ur 30 (*)    Bacteria, UA RARE (*)    All other components within normal limits  LIPASE, BLOOD  CBC    EKG None  Radiology CT Abdomen Pelvis W Contrast  Result Date: 07/18/2019 CLINICAL DATA:  Right lower quadrant abdominal pain. EXAM: CT ABDOMEN AND PELVIS WITH CONTRAST TECHNIQUE: Multidetector CT imaging of the abdomen and pelvis was performed using the standard protocol following bolus administration of intravenous contrast. CONTRAST:  OMNIPAQUE IOHEXOL 300 MG/ML  SOLN COMPARISON:  None. FINDINGS: Lower chest: No acute abnormality. Hepatobiliary: No focal liver abnormality is seen. No gallstones, gallbladder wall thickening, or biliary dilatation. Pancreas: Unremarkable. No pancreatic ductal dilatation or surrounding inflammatory changes. Spleen: Normal in size without focal abnormality. Adrenals/Urinary Tract: Adrenal glands are unremarkable. Kidneys are normal, without renal calculi, focal lesion, or hydronephrosis. Bladder is unremarkable. Stomach/Bowel: Stomach is within normal limits. Appendix appears normal. No evidence of bowel wall thickening, distention, or inflammatory changes. Vascular/Lymphatic: No significant vascular findings are present. No enlarged abdominal or pelvic lymph nodes. Reproductive: Prostate is unremarkable. Other: No abdominal wall hernia or abnormality. No abdominopelvic ascites. Musculoskeletal: No acute or significant osseous findings. IMPRESSION: No abnormality seen in the abdomen or pelvis. Electronically Signed   By: Lupita Raider M.D.   On: 07/18/2019 19:25    Procedures Procedures (including critical care time)  Medications Ordered in ED Medications  iohexol (OMNIPAQUE) 300 MG/ML solution 100 mL (100 mLs Intravenous Contrast Given 07/18/19 1844)    ED Course  I have reviewed the triage vital signs and the nursing notes.  Pertinent labs & imaging results that were available during my care of the patient were reviewed by me and considered in my  medical decision making (see chart for details).    MDM Rules/Calculators/A&P                     Patient has had persistent pain for several months.  At this time, CT does not show any acute findings.  Kidneys and renal function appear normal.  Pain is intermittent and spasmodic in nature.  Patient reports he has had ultrasound of the gallbladder previously.  Will discuss possible follow-up for HIDA scan and low-fat diet.  Will give patient hyoscyamine to try for pain episodes.  Return precautions and follow-up plan reviewed. Final Clinical Impression(s) / ED Diagnoses Final diagnoses:  Right lower quadrant abdominal pain    Rx / DC Orders ED Discharge Orders         Ordered    hyoscyamine (ANASPAZ) 0.125 MG TBDP disintergrating tablet  Every 6 hours PRN     07/18/19 2001  Arby Barrette, MD 07/18/19 2003

## 2019-07-18 NOTE — ED Triage Notes (Signed)
Patient c/o right sided abdominal/ side pain since November. States he had an endoscopy about a month ago and diagnosed with acid reflux. States when pain worsens he has dry throat and difficulty breathing.

## 2019-07-25 ENCOUNTER — Emergency Department (HOSPITAL_COMMUNITY)
Admission: EM | Admit: 2019-07-25 | Discharge: 2019-07-25 | Disposition: A | Discharge status: Home / Self Care | Payer: Medicaid Other | Attending: Emergency Medicine | Admitting: Emergency Medicine

## 2019-07-25 ENCOUNTER — Other Ambulatory Visit: Payer: Self-pay

## 2019-07-25 ENCOUNTER — Encounter (HOSPITAL_COMMUNITY): Payer: Self-pay | Admitting: Emergency Medicine

## 2019-07-25 DIAGNOSIS — Z87891 Personal history of nicotine dependence: Secondary | ICD-10-CM | POA: Diagnosis not present

## 2019-07-25 DIAGNOSIS — R1013 Epigastric pain: Secondary | ICD-10-CM | POA: Diagnosis not present

## 2019-07-25 DIAGNOSIS — R110 Nausea: Secondary | ICD-10-CM | POA: Insufficient documentation

## 2019-07-25 DIAGNOSIS — Z79899 Other long term (current) drug therapy: Secondary | ICD-10-CM | POA: Insufficient documentation

## 2019-07-25 DIAGNOSIS — K219 Gastro-esophageal reflux disease without esophagitis: Secondary | ICD-10-CM | POA: Insufficient documentation

## 2019-07-25 HISTORY — DX: Gastro-esophageal reflux disease without esophagitis: K21.9

## 2019-07-25 MED ORDER — LIDOCAINE VISCOUS HCL 2 % MT SOLN
15.0000 mL | Freq: Once | OROMUCOSAL | Status: AC
  Administered 2019-07-25: 15 mL via ORAL

## 2019-07-25 MED ORDER — ALUM & MAG HYDROXIDE-SIMETH 200-200-20 MG/5ML PO SUSP: 30.0000 mL | Freq: Once | ORAL | Status: DC

## 2019-07-25 MED ORDER — ALUM & MAG HYDROXIDE-SIMETH 200-200-20 MG/5ML PO SUSP
30.0000 mL | Freq: Once | ORAL | Status: AC
  Administered 2019-07-25: 30 mL via ORAL

## 2019-07-25 NOTE — ED Provider Notes (Signed)
[facility] EMERGENCY DEPARTMENT Provider Note   CSN: [id number] Arrival date & time: 07/25/19  3382     History Chief Complaint  Patient presents with   Gastroesophageal Reflux    Raymond Knox is a 34 y.o. male.  34 y.o male with a PMH of GERD presents to the ED with chief complaint of reflux. Patient has been seen previously along with gastroenterology follow-up.  Patient describes his pain as a burning sensation to the epigastric region with radiation to his throat.  Reports he is done changes to his diet, has been drinking water along with taking Protonix and Pepcid without relief for the past 6 months.  Patient was seen by GI yesterday, was advised that he will likely need a barium swallow study, this has yet to be scheduled.  He also had a normal CT abdomen this month, has had a normal EGD per patient.  Does endorse some nausea when burning sensation radiates to his throat.  No vomiting, fever, diarrhea, chest pain.  The history is provided by the patient.  Gastroesophageal Reflux Associated symptoms include abdominal pain.       Past Medical History:  Diagnosis Date   GERD (gastroesophageal reflux disease)     There are no problems to display for this patient.   No past surgical history on file.     No family history on file.  Social History   Tobacco Use   Smoking status: Former Smoker  Substance Use Topics   Alcohol use: No   Drug use: No    Home Medications Prior to Admission medications   Medication Sig Start Date End Date Taking? Authorizing Provider  albuterol (PROVENTIL HFA;VENTOLIN HFA) 108 (90 BASE) MCG/ACT inhaler Inhale 2 puffs into the lungs every 4 (four) hours as needed for wheezing or shortness of breath. 10/30/13   Cathren Laine, MD  famotidine (PEPCID) 20 MG tablet Take 1 tablet (20 mg total) by mouth 2 (two) times daily. 01/15/19   Mesner, Barbara Cower, MD  hyoscyamine (ANASPAZ) 0.125 MG TBDP disintergrating tablet Place 1  tablet (0.125 mg total) under the tongue every 6 (six) hours as needed for cramping. 07/18/19   Arby Barrette, MD  pantoprazole (PROTONIX) 20 MG tablet Take 1 tablet (20 mg total) by mouth daily. 01/15/19   Mesner, Barbara Cower, MD  polyethylene glycol (MIRALAX / GLYCOLAX) 17 g packet Take 17 g by mouth daily. 01/17/19   Elson Areas, PA-C  sucralfate (CARAFATE) 1 GM/10ML suspension Take 10 mLs (1 g total) by mouth 4 (four) times daily. 01/17/19 01/17/20  Elson Areas, PA-C    Allergies    Patient has no known allergies.  Review of Systems   Review of Systems  Constitutional: Negative for fever.  Gastrointestinal: Positive for abdominal pain and nausea.    Physical Exam Updated Vital Signs BP (!) 114/57 (BP Location: Right Arm)    Pulse 100    Temp 98.3 F (36.8 C) (Oral)    Resp 16    Ht 5\' 7"  (1.702 m)    Wt 90.7 kg    SpO2 100%    BMI 31.32 kg/m   Physical Exam Vitals and nursing note reviewed.  Constitutional:      Appearance: Normal appearance.  HENT:     Head: Normocephalic and atraumatic.     Nose: Nose normal.     Mouth/Throat:     Mouth: Mucous membranes are moist.  Eyes:     Pupils: Pupils are equal, round, and  reactive to light.  Cardiovascular:     Rate and Rhythm: Normal rate.  Pulmonary:     Effort: Pulmonary effort is normal.  Abdominal:     General: Abdomen is flat.     Tenderness: There is no abdominal tenderness.  Musculoskeletal:     Cervical back: Normal range of motion and neck supple.  Skin:    General: Skin is warm and dry.  Neurological:     Mental Status: He is alert and oriented to person, place, and time.     ED Results / Procedures / Treatments   Labs (all labs ordered are listed, but only abnormal results are displayed) Labs Reviewed - No data to display  EKG None  Radiology No results found.  Procedures Procedures (including critical care time)  Medications Ordered in ED Medications - No data to display  ED Course  I have  reviewed the triage vital signs and the nursing notes.  Pertinent labs & imaging results that were available during my care of the patient were reviewed by me and considered in my medical decision making (see chart for details).    MDM Rules/Calculators/A&P   Patient with a past medical history of reflux presents to the ED with complaints of burning sensation to the epigastric region for the past 6 months.  Evaluated in the ED previously, seen by gastroenterology yesterday, reports having blood work-up for site but was not told these results.  According to his chart which I have extensive review, he was seen by Dr. Douglass Rivers yesterday, was advised to trial Gaviscon which he can obtain over-the-counter.  He is currently on Protonix 40 mg twice daily before meals along with Pepcid.  Reports having a normal EGD in the past along with a recent CT abdomen which was within normal limits.  Not ill-appearing, noN toxic, without any fevers, vomiting, diarrhea.  He was provided with GI cocktail while in the ED.  Vitals remain stable.  He was advised that he will likely need a barium swallow study per GI however this has not been scheduled.  Discussed with patient that ultimately therapy will need to be determined by gastroenterology, he is currently on Protonix and Pepcid, unable to add on more medication for this.  Patient understands and agrees to management, provided with GI cocktail for improvement in symptoms.  Return precautions discussed at length.   Portions of this note were generated with [employer]. Dictation errors may occur despite best attempts at proofreading.  Final Clinical Impression(s) / ED Diagnoses Final diagnoses:  Gastroesophageal reflux disease, unspecified whether esophagitis present    Rx / DC Orders ED Discharge Orders    None       Janeece Fitting, PA-C 07/25/19 1030    Lajean Saver, MD 07/25/19 [phone]

## 2019-07-25 NOTE — ED Triage Notes (Signed)
Pt reports acid reflux and needs a medication change. Protonix and Pepcid are not working. States he was at [facility] ER last night and they took blood work so he would not like any taken here. Has a GI doc but was told this is the strongest medicine they can give him.

## 2019-07-25 NOTE — Discharge Instructions (Addendum)
Please continue taking your Protonix along with Pepcid to help with your symptoms.  You will ultimately need to follow-up with gastroenterology in order to obtain further management of care for your reflux.

## 2019-08-09 DIAGNOSIS — R1013 Epigastric pain: Secondary | ICD-10-CM | POA: Diagnosis not present

## 2019-08-17 ENCOUNTER — Encounter (HOSPITAL_COMMUNITY): Payer: Self-pay

## 2019-08-17 ENCOUNTER — Telehealth: Payer: Self-pay | Admitting: Gastroenterology

## 2019-08-17 ENCOUNTER — Other Ambulatory Visit: Payer: Self-pay

## 2019-08-17 ENCOUNTER — Ambulatory Visit (HOSPITAL_COMMUNITY)
Admission: EM | Admit: 2019-08-17 | Discharge: 2019-08-17 | Disposition: A | Discharge status: Home / Self Care | Payer: Medicaid Other | Attending: Family Medicine | Admitting: Family Medicine

## 2019-08-17 DIAGNOSIS — R079 Chest pain, unspecified: Secondary | ICD-10-CM | POA: Diagnosis not present

## 2019-08-17 DIAGNOSIS — K219 Gastro-esophageal reflux disease without esophagitis: Secondary | ICD-10-CM

## 2019-08-17 MED ORDER — SUCRALFATE 1 GM/10ML PO SUSP
1.0000 g | Freq: Three times a day (TID) | ORAL | 0 refills | Status: DC
Start: 2019-08-17 — End: 2019-09-06

## 2019-08-17 MED ORDER — ESOMEPRAZOLE MAGNESIUM 20 MG PO CPDR
20.0000 mg | DELAYED_RELEASE_CAPSULE | Freq: Two times a day (BID) | ORAL | 0 refills | Status: DC
Start: 2019-08-17 — End: 2019-08-29

## 2019-08-17 MED ORDER — ALUM & MAG HYDROXIDE-SIMETH 200-200-20 MG/5ML PO SUSP
30.0000 mL | Freq: Once | ORAL | Status: AC
  Administered 2019-08-17: 30 mL via ORAL

## 2019-08-17 MED ORDER — ALUM & MAG HYDROXIDE-SIMETH 200-200-20 MG/5ML PO SUSP
ORAL | Status: AC
  Filled 2019-08-17: qty 30

## 2019-08-17 MED ORDER — LIDOCAINE VISCOUS HCL 2 % MT SOLN
OROMUCOSAL | Status: AC
  Filled 2019-08-17: qty 15

## 2019-08-17 MED ORDER — LIDOCAINE VISCOUS HCL 2 % MT SOLN
15.0000 mL | Freq: Once | OROMUCOSAL | Status: AC
  Administered 2019-08-17: 15 mL via ORAL

## 2019-08-17 NOTE — ED Triage Notes (Signed)
Pt states he has acid reflux and has had midsternal chest painx42mos. Pt states the midsternal chest pain has been getting worse the past 2 days. Pt 8/10 c/o non radiating midsternal chest pain and epigastric pain. Pt is diaphretic. Pt denies N/V. Pt states he has a little bit of SOB. Pt has non labored breathing.

## 2019-08-17 NOTE — Discharge Instructions (Signed)
Please take Nexium twice daily before meals. Use carafate as well. Stop Pepcid and Protonix. Contact [facility] for a consult and Helicobacter pylori testing.

## 2019-08-17 NOTE — Telephone Encounter (Signed)
I have reviewed his records. It looks like they are thoroughly evaluating his symptoms and have additional recommendations after his esophagram. I am not sure that I have much more to add after reviewing their records. I would be happy to see him, but, again, I would be doing the same evaluation that they are. Thanks.

## 2019-08-17 NOTE — Telephone Encounter (Signed)
Hey Dr. Orvan Falconer- this patient called to make a follow up appointment from being in the hospital today. Patient was seen at [facility] back in April of this year. When asking why he wants to switch GI providers he states that he just feels like they are not helping him and that we see hes son. You are DOD for right now which is when he called. Hes records are in epic. Please advise on scheduling. Thank you!

## 2019-08-17 NOTE — ED Provider Notes (Signed)
[facility]   MRN: [id number] DOB: [birth year]  Subjective:   Raymond Knox is a 34 y.o. male presenting for 2 day hx of current and persistent midsternal chest pain.  Patient has been getting worked up for acid reflux with a gastroenterologist in [place].  He is currently taking Pepcid which makes his symptoms worse.  He is also using pantoprazole.  Reports that he has had a history of an EGD, barium swallow study and has been told that everything has been normal.  Last month, patient had labs and a CT scan done which were all unremarkable.  Patient would like to get a second opinion including a new PCP and discuss what other treatment options he has.  He has concerns for H. pylori infection given that his son recently tested positive for this.  To the best of his knowledge, he has not been tested for it.  No current facility-administered medications for this encounter.  Current Outpatient Medications:  .  albuterol (PROVENTIL HFA;VENTOLIN HFA) 108 (90 BASE) MCG/ACT inhaler, Inhale 2 puffs into the lungs every 4 (four) hours as needed for wheezing or shortness of breath., Disp: 1 Inhaler, Rfl: 1 .  famotidine (PEPCID) 20 MG tablet, Take 1 tablet (20 mg total) by mouth 2 (two) times daily., Disp: 10 tablet, Rfl: 0 .  pantoprazole (PROTONIX) 20 MG tablet, Take 1 tablet (20 mg total) by mouth daily., Disp: 30 tablet, Rfl: 0 .  polyethylene glycol (MIRALAX / GLYCOLAX) 17 g packet, Take 17 g by mouth daily., Disp: 14 each, Rfl: 0   Allergies  Allergen Reactions  . Dicyclomine Shortness Of Breath    Past Medical History:  Diagnosis Date  . GERD (gastroesophageal reflux disease)      History reviewed. No pertinent surgical history.  No family history on file.  Social History   Tobacco Use  . Smoking status: Never Smoker  Substance Use Topics  . Alcohol use: No  . Drug use: No    ROS   Objective:   Vitals: BP 121/81   Pulse 66   Temp 98.5 F (36.9 C) (Oral)    Resp 18   Ht 5\' 7"  (1.702 m)   Wt 186 lb (84.4 kg)   SpO2 99%   BMI 29.13 kg/m   Physical Exam Constitutional:      General: He is not in acute distress.    Appearance: Normal appearance. He is well-developed. He is not ill-appearing, toxic-appearing or diaphoretic.  HENT:     Head: Normocephalic and atraumatic.     Right Ear: External ear normal.     Left Ear: External ear normal.     Nose: Nose normal.     Mouth/Throat:     Mouth: Mucous membranes are moist.     Pharynx: Oropharynx is clear.  Eyes:     General: No scleral icterus.    Extraocular Movements: Extraocular movements intact.     Pupils: Pupils are equal, round, and reactive to light.  Cardiovascular:     Rate and Rhythm: Normal rate and regular rhythm.     Heart sounds: Normal heart sounds. No murmur. No friction rub. No gallop.   Pulmonary:     Effort: Pulmonary effort is normal. No respiratory distress.     Breath sounds: Normal breath sounds. No stridor. No wheezing, rhonchi or rales.  Chest:    Abdominal:     General: Bowel sounds are normal. There is no distension.     Palpations: Abdomen is soft.  There is no mass.     Tenderness: There is no abdominal tenderness. There is no guarding or rebound.  Skin:    General: Skin is warm and dry.  Neurological:     Mental Status: He is alert and oriented to person, place, and time.  Psychiatric:        Mood and Affect: Mood normal.        Behavior: Behavior normal.        Thought Content: Thought content normal.     ED ECG REPORT   Date: 08/17/2019  Rate: 82bpm  Rhythm: normal sinus rhythm  QRS Axis: normal  Intervals: normal  ST/T Wave abnormalities: nonspecific T wave changes  Conduction Disutrbances:none  Narrative Interpretation: Sinus rhythm at 82 bpm with nonspecific T wave inversion in lead III.  No previous EKG available for comparison.  Old EKG Reviewed: none available  I have personally reviewed the EKG tracing and agree with the  computerized printout as noted.   Assessment and Plan :   PDMP not reviewed this encounter.  1. Chest pain, unspecified type   2. Gastroesophageal reflux disease, unspecified whether esophagitis present     Recommended patient stop Pepcid and Protonix, start Nexium and Carafate.  Provided him with referral to [facility] gastroenterology and [facility] internal medicine for further work-up.  Unfortunately, we do not have H. pylori testing available to [place] at our clinic.  But I do have suspicion for this and recommended he call the GI office today to try and schedule an appointment for consult. Counseled patient on potential for adverse effects with medications prescribed/recommended today, ER and return-to-clinic precautions discussed, patient verbalized understanding.    Jaynee Eagles, PA-C 08/17/19 1309

## 2019-08-24 ENCOUNTER — Encounter: Payer: Self-pay | Admitting: Gastroenterology

## 2019-08-24 ENCOUNTER — Ambulatory Visit: Payer: Medicaid Other | Admitting: Gastroenterology

## 2019-08-24 VITALS — BP 108/76 | HR 110 | Ht 67.0 in | Wt 187.2 lb

## 2019-08-24 DIAGNOSIS — K219 Gastro-esophageal reflux disease without esophagitis: Secondary | ICD-10-CM | POA: Diagnosis not present

## 2019-08-24 DIAGNOSIS — G8929 Other chronic pain: Secondary | ICD-10-CM

## 2019-08-24 DIAGNOSIS — R1013 Epigastric pain: Secondary | ICD-10-CM | POA: Diagnosis not present

## 2019-08-24 NOTE — Patient Instructions (Signed)
If you are age 36 or older, your body mass index should be between 23-30. Your Body mass index is 29.32 kg/m. If this is out of the aforementioned range listed, please consider follow up with your Primary Care Provider.  If you are age 54 or younger, your body mass index should be between 19-25. Your Body mass index is 29.32 kg/m. If this is out of the aformentioned range listed, please consider follow up with your Primary Care Provider.   You have been scheduled for an endoscopy. Please follow written instructions given to you at your visit today. If you use inhalers (even only as needed), please bring them with you on the day of your procedure.  Continue taking pantoprazole 40 mg twice a day and famotidine 20mg  twice a day  Due to recent changes in healthcare laws, you may see the results of your imaging and laboratory studies on MyChart before your provider has had a chance to review them.  We understand that in some cases there may be results that are confusing or concerning to you. Not all laboratory results come back in the same time frame and the provider may be waiting for multiple results in order to interpret others.  Please give 48 hours in order for your provider to thoroughly review all the results before contacting the office for clarification of your results.

## 2019-08-24 NOTE — Progress Notes (Signed)
Referring Provider: No ref. provider found Primary Care Physician:  Patient, No Pcp Per  Reason for Consultation: Chest pain, reflux   IMPRESSION:  Atypical chest pain  GERD despite PPI and H2blocker therapy x 6 months    -Mild reflux noted on esophagram Shortness of breath 60 pounds unintentional weight loss over 6 months Possible pyelonephritis noted on recent CT scan 01/2019 Step-son with H pylori  Reflux with atypical chest pain: Concurrent weight loss is concerning.  I have recommended an EGD with esophageal and gastric biopsies given the differential of reflux esophagitis, eosinophilic esophagitis, H pylori, PUD, gastritis, celiac disease, malignancy, and non-erosive reflux disease.  Continue PPI BID, Pepcid twice daily, and Carafate.  Consider additional evaluation with esophageal manometry and 24-hour pH impedance testing if the EGD with biopsies is non-diagnostic.  Shortness of breath: May be related to LPR.  However, non-GI etiologies must be considered.  He reports an upcoming appointment to establish primary care within the next 2 to 3 weeks.  I asked him to review the symptoms at the time of that visit.  Abnormal kidney on CT scan with contrast 01/2019: Linear areas of hypoenhancement involving the upper pole of the left kidney.  Given the positional nature of his symptoms will have a low threshold to repeat his CT scan.   PLAN: Continue pantoprazole 40 mg BID and famotidine 20 mg BID Continue Carafate 1g slurry QID x 2 weeks Obtain EGD report from [facility] 05/2019 EGD with esophageal, gastric, and duodenal biopsies now Primary care provider to establish care and to review shortness of breath symptomatology Esophageal manometry/pH impedence testing if EGD with biopsies is nondiagnostic Low threshold to repeat CT scan given the abnormal kidney   Please see the "Patient Instructions" section for addition details about the plan.  HPI: Raymond Knox is a 34 y.o. male  self-referred for reflux.  The history is obtained to the patient and review of his electronic health record including CareEverywhere. Previously evaluated by multiple doctors at [facility] including most recently by GI 07/24/2019.  Works for [employer]. Lives between [place] and [address]   Developed progressive reflux with associated dysphagia to solids and liquids in October 2020 without improvement despite Protonix 40 mg twice daily and famotidine 20 mg twice daily.  He has been seen in the ED and had multiple visits to the GI department at [facility]. EGD without biopsies were normal.  CT of the abd/pelvis was negative for possible [place].  Abdominal ultrasound showed hepatic steatosis recent esophagram showed reflux. H pylori serum antibody was negative. He is frustrated that he doesn't have an answer and doesn't feel better.  On his last visit at [facility] 07/24/2019 it was recommended that he start Gaviscon.  Arrangements were made for esophagram, esophageal pH/manometry and consideration for colonoscopy plus/minus EGD with esophageal biopsies.  Near constant burning sensation in the epigastric region that radiates to his throat.  Feels this is causing drying of his throat which results in throat closing and shortness of breath.  Pain intermittently radiates from the epigastric region down to his right lower quadrant.  Sandwich meat chicken or [place], lettuce, avocado, and bread, apples, bananas and almond milk are the old the foods that he can tolerate.   Lying on his left side provides relief. Walking or other movement makes it worse.  Finds that wearing a mask only makes it worse because he feels he cannot breathe. No relief with 6 months of Protonix or Prevacid.  No odynophagia,  dysphonia, neck pain, or sore throat.  No change in bowel habits.  No blood in the stool.  No history of anemia.  He has lost 60 pounds since October.   Has been unable to work for 2-3 months due to the shortness of  breath and the acid. Rare SOB when sitting.  No orthopnea.  No chest pain. Cool air helps him relax and improves his symptoms.   Step-son was recently diagnosed with H pylori.  He spent an extensive amount of time googling his symptoms and is concerned that he has H. pylori.  Recent evaluation included: - H. pylori serum antibody was - 04/26/2019.   - HIV antigen antibody nonreactive 06/13/2019.   - EGD was performed 05/17/2019 at [facility] in February.  The results are not available.  The patient is concerned because biopsies were not obtained. - Normal CBC and comprehensive metabolic panel including liver enzymes 05/29/2019.  ALT 24. - CT of the abdomen pelvis 01/31/2019 suggested possible pyelonephritis otherwise there was no evidence of acute abdominal pelvic abnormality. - Abdominal ultrasound 06/07/2019 showed hepatic steatosis but was otherwise normal - Esophagram 08/14/2019 showed no evidence of hernia or esophageal motility disorder.  Minimal gastric reflux was noted with provocative maneuvers.  No known family history of colon cancer or polyps. No family history of uterine/endometrial cancer, pancreatic cancer or gastric/stomach cancer.   Past Medical History:  Diagnosis Date  . Acid reflux   . GERD (gastroesophageal reflux disease)     No past surgical history on file.  Current Outpatient Medications  Medication Sig Dispense Refill  . esomeprazole (NEXIUM) 20 MG capsule Take 1 capsule (20 mg total) by mouth 2 (two) times daily before a meal. 180 capsule 0  . sucralfate (CARAFATE) 1 GM/10ML suspension Take 10 mLs (1 g total) by mouth 4 (four) times daily -  with meals and at bedtime. 1000 mL 0  . albuterol (PROVENTIL HFA;VENTOLIN HFA) 108 (90 BASE) MCG/ACT inhaler Inhale 2 puffs into the lungs every 4 (four) hours as needed for wheezing or shortness of breath. (Patient not taking: Reported on 08/24/2019) 1 Inhaler 1  . famotidine (PEPCID) 20 MG tablet Take 1 tablet (20 mg total) by  mouth 2 (two) times daily. (Patient not taking: Reported on 08/24/2019) 10 tablet 0  . pantoprazole (PROTONIX) 20 MG tablet Take 1 tablet (20 mg total) by mouth daily. (Patient not taking: Reported on 08/24/2019) 30 tablet 0   No current facility-administered medications for this visit.    Allergies as of 08/24/2019 - Review Complete 08/24/2019  Allergen Reaction Noted  . Dicyclomine Shortness Of Breath 06/13/2019    No family history on file.  Social History   Socioeconomic History  . Marital status: Married    Spouse name: Not on file  . Number of children: Not on file  . Years of education: Not on file  . Highest education level: Not on file  Occupational History  . Occupation: Aeronautical engineer  Tobacco Use  . Smoking status: Never Smoker  . Smokeless tobacco: Never Used  Substance and Sexual Activity  . Alcohol use: No  . Drug use: No  . Sexual activity: Not on file  Other Topics Concern  . Not on file  Social History Narrative   ** Merged History Encounter **       Social Determinants of Health   Financial Resource Strain:   . Difficulty of Paying Living Expenses:   Food Insecurity:   . Worried About [employer] of  Food in the Last Year:   . [place] in the Last Year:   Transportation Needs:   . Film/video editor (Medical):   Marland Kitchen Lack of Transportation (Non-Medical):   Physical Activity:   . Days of Exercise per Week:   . Minutes of Exercise per Session:   Stress:   . Feeling of Stress :   Social Connections:   . Frequency of Communication with Friends and Family:   . Frequency of Social Gatherings with Friends and Family:   . Attends Religious Services:   . Active Member of Clubs or Organizations:   . Attends Archivist Meetings:   Marland Kitchen Marital Status:   Intimate Partner Violence:   . Fear of Current or Ex-Partner:   . Emotionally Abused:   Marland Kitchen Physically Abused:   . Sexually Abused:     Review of Systems: 12 system ROS is negative  except as noted above except for headaches and shortness of breath.   Physical Exam: General:   Alert,  well-nourished, pleasant and cooperative in NAD Head:  Normocephalic and atraumatic. Eyes:  Sclera clear, no icterus.   Conjunctiva pink. Ears:  Normal auditory acuity. Nose:  No deformity, discharge,  or lesions. Mouth:  No deformity or lesions.   Neck:  Supple; no masses or thyromegaly. Lungs:  Clear throughout to auscultation.   No wheezes. Heart:  Regular rate and rhythm; no murmurs. Abdomen:  Soft,nontender, nondistended, normal bowel sounds, no rebound or guarding. No hepatosplenomegaly.   Rectal:  Deferred  Msk:  Symmetrical. No boney deformities LAD: No inguinal or umbilical LAD Extremities:  No clubbing or edema. Neurologic:  Alert and  oriented x4;  grossly nonfocal Skin:  Intact without significant lesions or rashes. Multiple tattoos.  Psych:  Alert and cooperative. Normal mood and affect.     Iesha Summerhill L. Tarri Glenn, MD, MPH 08/24/2019, 1:30 PM

## 2019-08-25 ENCOUNTER — Ambulatory Visit (INDEPENDENT_AMBULATORY_CARE_PROVIDER_SITE_OTHER): Payer: Medicaid Other

## 2019-08-25 ENCOUNTER — Other Ambulatory Visit: Payer: Self-pay | Admitting: Gastroenterology

## 2019-08-25 DIAGNOSIS — Z1159 Encounter for screening for other viral diseases: Secondary | ICD-10-CM

## 2019-08-26 LAB — SARS CORONAVIRUS 2 (TAT 6-24 HRS): SARS Coronavirus 2: NEGATIVE

## 2019-08-29 ENCOUNTER — Encounter: Payer: Self-pay | Admitting: Gastroenterology

## 2019-08-29 ENCOUNTER — Ambulatory Visit (AMBULATORY_SURGERY_CENTER): Payer: Medicaid Other | Admitting: Gastroenterology

## 2019-08-29 ENCOUNTER — Other Ambulatory Visit: Payer: Self-pay

## 2019-08-29 VITALS — BP 122/80 | HR 71 | Temp 97.5°F | Resp 16 | Ht 67.0 in | Wt 187.0 lb

## 2019-08-29 DIAGNOSIS — R1013 Epigastric pain: Secondary | ICD-10-CM | POA: Diagnosis not present

## 2019-08-29 DIAGNOSIS — K2950 Unspecified chronic gastritis without bleeding: Secondary | ICD-10-CM | POA: Diagnosis not present

## 2019-08-29 DIAGNOSIS — K3189 Other diseases of stomach and duodenum: Secondary | ICD-10-CM

## 2019-08-29 DIAGNOSIS — K317 Polyp of stomach and duodenum: Secondary | ICD-10-CM

## 2019-08-29 DIAGNOSIS — K219 Gastro-esophageal reflux disease without esophagitis: Secondary | ICD-10-CM

## 2019-08-29 MED ORDER — ESOMEPRAZOLE MAGNESIUM 40 MG PO CPDR
DELAYED_RELEASE_CAPSULE | ORAL | 1 refills | Status: AC
Start: 2019-08-29 — End: ?

## 2019-08-29 MED ORDER — SODIUM CHLORIDE 0.9 % IV SOLN: 500.0000 mL | Freq: Once | INTRAVENOUS | Status: DC

## 2019-08-29 NOTE — Progress Notes (Signed)
Called to room to assist during endoscopic procedure.  Patient ID and intended procedure confirmed with present staff. Received instructions for my participation in the procedure from the performing physician.  

## 2019-08-29 NOTE — Progress Notes (Signed)
   NEXIUM 40 MG TWICE DAILY BY MOUTH PER VO  DR BEAVERS / P Kynnedy Carreno RN

## 2019-08-29 NOTE — Op Note (Signed)
[facility] Patient Name: Raymond Knox Procedure Date: 08/29/2019 3:51 PM MRN: [id number] Endoscopist: Tressia Danas MD, MD Age: 34 Referring MD:  Date of Birth: 08/21/1985 Gender: Male Account #: [id number] Procedure:                Upper GI endoscopy Indications:              Esophageal reflux symptoms that persist despite                            appropriate therapy, Unexplained chest pain, Weight                            loss Medicines:                Monitored Anesthesia Care Procedure:                Pre-Anesthesia Assessment:                           - Prior to the procedure, a History and Physical                            was performed, and patient medications and                            allergies were reviewed. The patient's tolerance of                            previous anesthesia was also reviewed. The risks                            and benefits of the procedure and the sedation                            options and risks were discussed with the patient.                            All questions were answered, and informed consent                            was obtained. Prior Anticoagulants: The patient has                            taken no previous anticoagulant or antiplatelet                            agents. ASA Grade Assessment: II - A patient with                            mild systemic disease. After reviewing the risks                            and benefits, the patient was deemed in  satisfactory condition to undergo the procedure.                           After obtaining informed consent, the endoscope was                            passed under direct vision. Throughout the                            procedure, the patient's blood pressure, pulse, and                            oxygen saturations were monitored continuously. The                            Endoscope was introduced through the mouth, and                             advanced to the third part of duodenum. The upper                            GI endoscopy was accomplished without difficulty.                            The patient tolerated the procedure well. Scope In: Scope Out: Findings:                 The examined esophagus was normal. Biopsies were                            obtained from the proximal and distal esophagus                            with cold forceps for histology.                           Diffuse mildly erythematous mucosa without bleeding                            was found in the gastric body and antrum. Biopsies                            were taken from the antrum, body, and fundus with a                            cold forceps for histology. Estimated blood loss                            was minimal.                           A few small sessile polyps with no stigmata of  recent bleeding were found in the gastric fundus                            and in the gastric body. Biopsies were taken with a                            cold forceps for histology. Estimated blood loss                            was minimal.                           The examined duodenum was normal. Biopsies were                            taken with a cold forceps for histology. Estimated                            blood loss was minimal.                           The cardia and gastric fundus were normal on                            retroflexion.                           The exam was otherwise without abnormality. Complications:            No immediate complications. Estimated blood loss:                            Minimal. Estimated Blood Loss:     Estimated blood loss was minimal. Impression:               - Normal esophagus. Biopsied.                           - Erythematous mucosa in the gastric body and                            antrum. Biopsied.                           - A few  gastric polyps. Likely fundic gland polyps.                            Biopsied.                           - Normal examined duodenum. Biopsied.                           - The examination was otherwise normal. Recommendation:           - Patient has a contact number available for  emergencies. The signs and symptoms of potential                            delayed complications were discussed with the                            patient. Return to normal activities tomorrow.                            Written discharge instructions were provided to the                            patient.                           - Resume previous diet.                           - Continue present medications including                            pantoprazole 40 mg BID, famotidine 20 mg BID, and                            carafate 1g slurry QID                           - No aspirin, ibuprofen, naproxen, or other                            non-steroidal anti-inflammatory drugs.                           - Await pathology results. Tressia Danas MD, MD 08/29/2019 4:09:00 PM This report has been signed electronically.

## 2019-08-29 NOTE — Progress Notes (Signed)
Temp-LC VS-DT 

## 2019-08-29 NOTE — Patient Instructions (Addendum)
Await results of biopsies done by Dr Orvan Falconer today   Take Nexium 40 mg twice a day per Dr Orvan Falconer- PICK UP AT [facility] [address] -   Famotidine 20 mg twice a day  And Carafate 1 gm slurry ( suspension ) 4 times a day  NO ASPIRIN ,IBUPROFEN,NAPROXEN, OR OTHER ANTI INFLAMMATORY DRUGS   YOU HAD AN ENDOSCOPIC PROCEDURE TODAY AT THE [facility]:   Refer to the procedure report that was given to you for any specific questions about what was found during the examination.  If the procedure report does not answer your questions, please call your gastroenterologist to clarify.  If you requested that your care partner not be given the details of your procedure findings, then the procedure report has been included in a sealed envelope for you to review at your convenience later.  YOU SHOULD EXPECT: Some feelings of bloating in the abdomen. Passage of more gas than usual.  Walking can help get rid of the air that was put into your GI tract during the procedure and reduce the bloating. If you had a lower endoscopy (such as a colonoscopy or flexible sigmoidoscopy) you may notice spotting of blood in your stool or on the toilet paper. If you underwent a bowel prep for your procedure, you may not have a normal bowel movement for a few days.  Please Note:  You might notice some irritation and congestion in your nose or some drainage.  This is from the oxygen used during your procedure.  There is no need for concern and it should clear up in a day or so.  SYMPTOMS TO REPORT IMMEDIATELY:    Following upper endoscopy (EGD)  Vomiting of blood or coffee ground material  New chest pain or pain under the shoulder blades  Painful or persistently difficult swallowing  New shortness of breath  Fever of 100F or higher  Black, tarry-looking stools  For urgent or emergent issues, a gastroenterologist can be reached at any hour by calling [phone]. Do not use MyChart messaging for urgent  concerns.    DIET:  We do recommend a small meal at first, but then you may proceed to your regular diet.  Drink plenty of fluids but you should avoid alcoholic beverages for 24 hours.  ACTIVITY:  You should plan to take it easy for the rest of today and you should NOT DRIVE or use heavy machinery until tomorrow (because of the sedation medicines used during the test).    FOLLOW UP: Our staff will call the number listed on your records 48-72 hours following your procedure to check on you and address any questions or concerns that you may have regarding the information given to you following your procedure. If we do not reach you, we will leave a message.  We will attempt to reach you two times.  During this call, we will ask if you have developed any symptoms of COVID 19. If you develop any symptoms (ie: fever, flu-like symptoms, shortness of breath, cough etc.) before then, please call [phone].  If you test positive for Covid 19 in the 2 weeks post procedure, please call and report this information to [place].    If any biopsies were taken you will be contacted by phone or by letter within the next 1-3 weeks.  Please call us at [phone] if you have not heard about the biopsies in 3 weeks.    SIGNATURES/CONFIDENTIALITY: You and/or your care partner have signed paperwork  which will be entered into your electronic medical record.  These signatures attest to the fact that that the information above on your After Visit Summary has been reviewed and is understood.  Full responsibility of the confidentiality of this discharge information lies with you and/or your care-partner.

## 2019-08-29 NOTE — Progress Notes (Signed)
A and O x3. Report to RN. Tolerated MAC anesthesia well.Teeth unchanged after procedure.

## 2019-08-30 ENCOUNTER — Other Ambulatory Visit: Payer: Self-pay

## 2019-08-30 DIAGNOSIS — K219 Gastro-esophageal reflux disease without esophagitis: Secondary | ICD-10-CM

## 2019-08-30 MED ORDER — FAMOTIDINE 20 MG PO TABS: 20.0000 mg | ORAL_TABLET | Freq: Two times a day (BID) | ORAL | 3 refills | Status: DC

## 2019-08-31 ENCOUNTER — Telehealth: Payer: Self-pay | Admitting: *Deleted

## 2019-08-31 ENCOUNTER — Telehealth: Payer: Self-pay

## 2019-08-31 NOTE — Telephone Encounter (Signed)
  Follow up Call-  Call back number 08/29/2019  Post procedure Call Back phone  # [phone]  Permission to leave phone message Yes  Some recent data might be hidden     Patient questions:  Do you have a fever, pain , or abdominal swelling? No. Pain Score  0 *  Have you tolerated food without any problems? Yes.    Have you been able to return to your normal activities? Yes.    Do you have any questions about your discharge instructions: Diet   No. Medications  No. Follow up visit  No.  Do you have questions or concerns about your Care? No.  Actions: * If pain score is 4 or above: No action needed, pain <4. 1. Have you developed a fever since your procedure? no  2.   Have you had an respiratory symptoms (SOB or cough) since your procedure? no  3.   Have you tested positive for COVID 19 since your procedure no  4.   Have you had any family members/close contacts diagnosed with the COVID 19 since your procedure?  no   If yes to any of these questions please route to Laverna Peace, RN and Charlett Lango, RN

## 2019-08-31 NOTE — Telephone Encounter (Signed)
Follow call made, left message. 

## 2019-09-06 ENCOUNTER — Other Ambulatory Visit: Payer: Self-pay

## 2019-09-06 DIAGNOSIS — K219 Gastro-esophageal reflux disease without esophagitis: Secondary | ICD-10-CM

## 2019-09-06 MED ORDER — SUCRALFATE 1 GM/10ML PO SUSP: 1.0000 g | Freq: Three times a day (TID) | ORAL | 3 refills | Status: DC

## 2019-09-11 ENCOUNTER — Other Ambulatory Visit: Payer: Self-pay | Admitting: Gastroenterology

## 2019-09-11 NOTE — Telephone Encounter (Signed)
Spoke with patient, patient advised that RX for Carafate was sent on 09/06/19 with 3 refills. Pt advised to contact pharmacy, pt advised to call the office with any other concerns.

## 2019-09-22 ENCOUNTER — Ambulatory Visit (INDEPENDENT_AMBULATORY_CARE_PROVIDER_SITE_OTHER): Payer: Medicaid Other | Admitting: Adult Health

## 2019-09-22 ENCOUNTER — Encounter: Payer: Self-pay | Admitting: Adult Health

## 2019-09-22 ENCOUNTER — Other Ambulatory Visit: Payer: Self-pay

## 2019-09-22 VITALS — BP 128/84 | HR 73 | Temp 97.3°F | Resp 16 | Wt 188.4 lb

## 2019-09-22 DIAGNOSIS — K219 Gastro-esophageal reflux disease without esophagitis: Secondary | ICD-10-CM | POA: Diagnosis not present

## 2019-09-22 DIAGNOSIS — R110 Nausea: Secondary | ICD-10-CM | POA: Diagnosis not present

## 2019-09-22 DIAGNOSIS — T7840XA Allergy, unspecified, initial encounter: Secondary | ICD-10-CM | POA: Insufficient documentation

## 2019-09-22 DIAGNOSIS — Z0000 Encounter for general adult medical examination without abnormal findings: Secondary | ICD-10-CM

## 2019-09-22 DIAGNOSIS — R5383 Other fatigue: Secondary | ICD-10-CM

## 2019-09-22 DIAGNOSIS — R070 Pain in throat: Secondary | ICD-10-CM

## 2019-09-22 DIAGNOSIS — Z13220 Encounter for screening for lipoid disorders: Secondary | ICD-10-CM

## 2019-09-22 MED ORDER — EPINEPHRINE 0.3 MG/0.3ML IJ SOAJ: 0.3000 mg | INTRAMUSCULAR | 1 refills | Status: AC | PRN

## 2019-09-22 NOTE — Patient Instructions (Signed)
Major Depressive Disorder, Adult Major depressive disorder (MDD) is a mental health condition. MDD often makes you feel sad, hopeless, or helpless. MDD can also cause symptoms in your body. MDD can affect your:  Work.  School.  Relationships.  Other normal activities. MDD can range from mild to very bad. It may occur once (single episode MDD). It can also occur many times (recurrent MDD). The main symptoms of MDD often include:  Feeling sad, depressed, or irritable most of the time.  Loss of interest. MDD symptoms also include:  Sleeping too much or too little.  Eating too much or too little.  A change in your weight.  Feeling tired (fatigue) or having low energy.  Feeling worthless.  Feeling guilty.  Trouble making decisions.  Trouble thinking clearly.  Thoughts of suicide or harming others.  Feeling weak.  Feeling agitated.  Keeping yourself from being around other people (isolation). Follow these instructions at home: Activity  Do these things as told by your doctor: ? Go back to your normal activities. ? Exercise regularly. ? Spend time outdoors. Alcohol  Talk with your doctor about how alcohol can affect your antidepressant medicines.  Do not drink alcohol. Or, limit how much alcohol you drink. ? This means no more than 1 drink a day for nonpregnant women and 2 drinks a day for men. One drink equals one of these:  12 oz of beer.  5 oz of wine.  1 oz of hard liquor. General instructions  Take over-the-counter and prescription medicines only as told by your doctor.  Eat a healthy diet.  Get plenty of sleep.  Find activities that you enjoy. Make time to do them.  Think about joining a support group. Your doctor may be able to suggest a group for you.  Keep all follow-up visits as told by your doctor. This is important. Where to find more information:  [employer] on Mental Illness: ? www.nami.org  [place] [employer] of Mental  Health: ? http://www.maynard.net/  National Suicide Prevention Lifeline: ? 609-187-6598. This is free, 24-hour help. Contact a doctor if:  Your symptoms get worse.  You have new symptoms. Get help right away if:  You self-harm.  You see, hear, taste, smell, or feel things that are not present (hallucinate). If you ever feel like you may hurt yourself or others, or have thoughts about taking your own life, get help right away. You can go to your nearest emergency department or call:  Your local emergency services (911 in the [place]  A suicide crisis helpline, such as the National Suicide Prevention Lifeline: ? 2051346143. This is open 24 hours a day. This information is not intended to replace advice given to you by your health care provider. Make sure you discuss any questions you have with your health care provider. Document Revised: 03/05/2017 Document Reviewed: 12/08/2015 Elsevier Patient Education  2020 [employer]. Food Choices for Gastroesophageal Reflux Disease, Adult When you have gastroesophageal reflux disease (GERD), the foods you eat and your eating habits are very important. Choosing the right foods can help ease your discomfort. Think about working with a nutrition specialist (dietitian) to help you make good choices. What are tips for following this plan?  Meals  Choose healthy foods that are low in fat, such as fruits, vegetables, whole grains, low-fat dairy products, and lean meat, fish, and poultry.  Eat small meals often instead of 3 large meals a day. Eat your meals slowly, and in a place where you are relaxed.  Avoid bending over or lying down until 2-3 hours after eating.  Avoid eating meals 2-3 hours before bed.  Avoid drinking a lot of liquid with meals.  Cook foods using methods other than frying. Bake, grill, or broil food instead.  Avoid or limit: ? Chocolate. ? Peppermint or spearmint. ? Alcohol. ? Pepper. ? Black and decaffeinated  coffee. ? Black and decaffeinated tea. ? Bubbly (carbonated) soft drinks. ? Caffeinated energy drinks and soft drinks.  Limit high-fat foods such as: ? Fatty meat or fried foods. ? Whole milk, cream, butter, or ice cream. ? Nuts and nut butters. ? Pastries, donuts, and sweets made with butter or shortening.  Avoid foods that cause symptoms. These foods may be different for everyone. Common foods that cause symptoms include: ? Tomatoes. ? Oranges, lemons, and limes. ? Peppers. ? Spicy food. ? Onions and garlic. ? Vinegar. Lifestyle  Maintain a healthy weight. Ask your doctor what weight is healthy for you. If you need to lose weight, work with your doctor to do so safely.  Exercise for at least 30 minutes for 5 or more days each week, or as told by your doctor.  Wear loose-fitting clothes.  Do not smoke. If you need help quitting, ask your doctor.  Sleep with the head of your bed higher than your feet. Use a wedge under the mattress or blocks under the bed frame to raise the head of the bed. Summary  When you have gastroesophageal reflux disease (GERD), food and lifestyle choices are very important in easing your symptoms.  Eat small meals often instead of 3 large meals a day. Eat your meals slowly, and in a place where you are relaxed.  Limit high-fat foods such as fatty meat or fried foods.  Avoid bending over or lying down until 2-3 hours after eating.  Avoid peppermint and spearmint, caffeine, alcohol, and chocolate. This information is not intended to replace advice given to you by your health care provider. Make sure you discuss any questions you have with your health care provider. Document Revised: 07/14/2018 Document Reviewed: 04/28/2016 Elsevier Patient Education  2020 [employer]. Health Maintenance, Male Adopting a healthy lifestyle and getting preventive care are important in promoting health and wellness. Ask your health care provider about:  The right  schedule for you to have regular tests and exams.  Things you can do on your own to prevent diseases and keep yourself healthy. What should I know about diet, weight, and exercise? Eat a healthy diet   Eat a diet that includes plenty of vegetables, fruits, low-fat dairy products, and lean protein.  Do not eat a lot of foods that are high in solid fats, added sugars, or sodium. Maintain a healthy weight Body mass index (BMI) is a measurement that can be used to identify possible weight problems. It estimates body fat based on height and weight. Your health care provider can help determine your BMI and help you achieve or maintain a healthy weight. Get regular exercise Get regular exercise. This is one of the most important things you can do for your health. Most adults should:  Exercise for at least 150 minutes each week. The exercise should increase your heart rate and make you sweat (moderate-intensity exercise).  Do strengthening exercises at least twice a week. This is in addition to the moderate-intensity exercise.  Spend less time sitting. Even light physical activity can be beneficial. Watch cholesterol and blood lipids Have your blood tested for lipids and cholesterol at  36 years of age, then have this test every 5 years. You may need to have your cholesterol levels checked more often if:  Your lipid or cholesterol levels are high.  You are older than 36 years of age.  You are at high risk for heart disease. What should I know about cancer screening? Many types of cancers can be detected early and may often be prevented. Depending on your health history and family history, you may need to have cancer screening at various ages. This may include screening for:  Colorectal cancer.  Prostate cancer.  Skin cancer.  Lung cancer. What should I know about heart disease, diabetes, and high blood pressure? Blood pressure and heart disease  High blood pressure causes heart  disease and increases the risk of stroke. This is more likely to develop in people who have high blood pressure readings, are of African descent, or are overweight.  Talk with your health care provider about your target blood pressure readings.  Have your blood pressure checked: ? Every 3-5 years if you are 79-46 years of age. ? Every year if you are 72 years old or older.  If you are between the ages of 46 and 40 and are a current or former smoker, ask your health care provider if you should have a one-time screening for abdominal aortic aneurysm (AAA). Diabetes Have regular diabetes screenings. This checks your fasting blood sugar level. Have the screening done:  Once every three years after age 46 if you are at a normal weight and have a low risk for diabetes.  More often and at a younger age if you are overweight or have a high risk for diabetes. What should I know about preventing infection? Hepatitis B If you have a higher risk for hepatitis B, you should be screened for this virus. Talk with your health care provider to find out if you are at risk for hepatitis B infection. Hepatitis C Blood testing is recommended for:  Everyone born from [birth year] through 1967.  Anyone with known risk factors for hepatitis C. Sexually transmitted infections (STIs)  You should be screened each year for STIs, including gonorrhea and chlamydia, if: ? You are sexually active and are younger than 36 years of age. ? You are older than 36 years of age and your health care provider tells you that you are at risk for this type of infection. ? Your sexual activity has changed since you were last screened, and you are at increased risk for chlamydia or gonorrhea. Ask your health care provider if you are at risk.  Ask your health care provider about whether you are at high risk for HIV. Your health care provider may recommend a prescription medicine to help prevent HIV infection. If you choose to take medicine  to prevent HIV, you should first get tested for HIV. You should then be tested every 3 months for as long as you are taking the medicine. Follow these instructions at home: Lifestyle  Do not use any products that contain nicotine or tobacco, such as cigarettes, e-cigarettes, and chewing tobacco. If you need help quitting, ask your health care provider.  Do not use street drugs.  Do not share needles.  Ask your health care provider for help if you need support or information about quitting drugs. Alcohol use  Do not drink alcohol if your health care provider tells you not to drink.  If you drink alcohol: ? Limit how much you have to 0-2 drinks a  day. ? Be aware of how much alcohol is in your drink. In the [place] one drink equals one 12 oz bottle of beer (355 mL), one 5 oz glass of wine (148 mL), or one 1 oz glass of hard liquor (44 mL). General instructions  Schedule regular health, dental, and eye exams.  Stay current with your vaccines.  Tell your health care provider if: ? You often feel depressed. ? You have ever been abused or do not feel safe at home. Summary  Adopting a healthy lifestyle and getting preventive care are important in promoting health and wellness.  Follow your health care provider's instructions about healthy diet, exercising, and getting tested or screened for diseases.  Follow your health care provider's instructions on monitoring your cholesterol and blood pressure. This information is not intended to replace advice given to you by your health care provider. Make sure you discuss any questions you have with your health care provider. Document Revised: 03/16/2018 Document Reviewed: 03/16/2018 Elsevier Patient Education  2020 [employer]. E

## 2019-09-22 NOTE — Progress Notes (Signed)
New patient visit   Patient: Raymond Knox   DOB: 06/18/1985   34 y.o. Male  MRN: [id number] Visit Date: 09/22/2019  Today's healthcare provider: Jairo Ben, FNP   Chief Complaint  Patient presents with  . New Patient (Initial Visit)   Subjective    Raymond Knox is a 34 y.o. male who presents today as a new patient to establish care.  HPI  Patient presents in office today to establish care he comes from [facility].Patient reports that he feels fairly well but has issues to discuss with primary physician.   He sees Dr. Tressia Danas for gastro and neurology.  He has had an upper GI endoscopy.  He is currently taking Nexium 40 mg daily.  He has an appointment for an esophageal manometry November 08, 2019 with preadmission Covid testing on Saturday, July 21.  He reports he has had no change in his GI symptoms.  He is depressed, his PHQ score is high today in the office, has not been able to find work since COVID-19, offered medications, or therapy, however patient declines at this time he is aware he can follow-up at any time should he desire any of those options.  Stress management and stress reduction advised.  Resources given. Patient  denies any fever, body aches,chills, rash, chest pain, shortness of breath,  vomiting, or diarrhea.  Denies dizziness, lightheadedness, pre syncopal or syncopal episodes.    Past Medical History:  Diagnosis Date  . Acid reflux   . Asthma    as a child for about 1 month-went away  . GERD (gastroesophageal reflux disease)    History reviewed. No pertinent surgical history. Family Status  Relation Name Status  . Mother  Alive  . Father  Alive  . Neg Hx  (Not Specified)   Family History  Problem Relation Age of Onset  . Colon cancer Neg Hx   . Esophageal cancer Neg Hx   . Rectal cancer Neg Hx   . Stomach cancer Neg Hx    Social History   Socioeconomic History  . Marital status: Married    Spouse name: Not on  file  . Number of children: Not on file  . Years of education: Not on file  . Highest education level: Not on file  Occupational History  . Occupation: Aeronautical engineer  Tobacco Use  . Smoking status: Never Smoker  . Smokeless tobacco: Never Used  Vaping Use  . Vaping Use: Never used  Substance and Sexual Activity  . Alcohol use: No  . Drug use: No  . Sexual activity: Not on file  Other Topics Concern  . Not on file  Social History Narrative   ** Merged History Encounter **       Social Determinants of Health   Financial Resource Strain:   . Difficulty of Paying Living Expenses:   Food Insecurity:   . Worried About Programme researcher, broadcasting/film/video in the Last Year:   . Barista in the Last Year:   Transportation Needs:   . Freight forwarder (Medical):   Marland Kitchen Lack of Transportation (Non-Medical):   Physical Activity:   . Days of Exercise per Week:   . Minutes of Exercise per Session:   Stress:   . Feeling of Stress :   Social Connections:   . Frequency of Communication with Friends and Family:   . Frequency of Social Gatherings with Friends and Family:   . Attends Religious Services:   .  Active Member of Clubs or Organizations:   . Attends Archivist Meetings:   Marland Kitchen Marital Status:    Outpatient Medications Prior to Visit  Medication Sig  . albuterol (PROVENTIL HFA;VENTOLIN HFA) 108 (90 BASE) MCG/ACT inhaler Inhale 2 puffs into the lungs every 4 (four) hours as needed for wheezing or shortness of breath. (Patient not taking: Reported on 08/24/2019)  . esomeprazole (NEXIUM) 40 MG capsule NEXIUM 40 MG TWICE DAILY  . [DISCONTINUED] famotidine (PEPCID) 20 MG tablet Take 1 tablet (20 mg total) by mouth 2 (two) times daily.  . [DISCONTINUED] sucralfate (CARAFATE) 1 GM/10ML suspension Take 10 mLs (1 g total) by mouth 4 (four) times daily -  with meals and at bedtime.   No facility-administered medications prior to visit.   Allergies  Allergen Reactions  . Dicyclomine  Shortness Of Breath    Immunization History  Administered Date(s) Administered  . Influenza,inj,Quad PF,6+ Mos 02/17/2019    Health Maintenance  Topic Date Due  . Hepatitis C Screening  Never done  . HIV Screening  Never done  . TETANUS/TDAP  Never done  . INFLUENZA VACCINE  11/05/2019    Patient Care Team: Doreen Beam, FNP as PCP - General (Family Medicine)  Review of Systems  Constitutional: Positive for activity change, appetite change, fatigue and unexpected weight change. Negative for chills, diaphoresis and fever.  HENT: Positive for mouth sores (He reports at times it feels like his tongue goes numb in the side of his mouth swells intermittently, he has cut out most foods except for [place] and ham due to these issues.), sore throat and trouble swallowing (At times after other foods.). Negative for congestion, dental problem, drooling, ear discharge, ear pain, facial swelling, hearing loss, sinus pressure, sinus pain, sneezing and tinnitus.   Respiratory: Positive for cough and shortness of breath. Negative for apnea, choking, chest tightness, wheezing and stridor.   Cardiovascular: Negative.   Gastrointestinal: Negative.   Endocrine: Positive for heat intolerance. Negative for cold intolerance, polydipsia, polyphagia and polyuria.  Genitourinary: Negative.   Musculoskeletal: Negative.   Skin: Negative.   Neurological: Negative.   Hematological: Negative.   Psychiatric/Behavioral: Negative for self-injury and suicidal ideas.       Reports depressed he has not been able to find a job since the COVID-19 pandemic began he was working a lot previously.  All other systems reviewed and are negative.   Last CBC Lab Results  Component Value Date   WBC 8.5 07/18/2019   HGB 16.4 07/18/2019   HCT 48.8 07/18/2019   MCV 88.6 07/18/2019   MCH 29.8 07/18/2019   RDW 12.7 07/18/2019   PLT 314 37/01/6268   Last metabolic panel Lab Results  Component Value Date   GLUCOSE  107 (H) 07/18/2019   NA 140 07/18/2019   K 4.3 07/18/2019   CL 103 07/18/2019   CO2 30 07/18/2019   BUN 11 07/18/2019   CREATININE 1.16 07/18/2019   GFRNONAA >60 07/18/2019   GFRAA >60 07/18/2019   CALCIUM 9.3 07/18/2019   PROT 7.4 07/18/2019   ALBUMIN 4.0 07/18/2019   BILITOT 1.1 07/18/2019   ALKPHOS 59 07/18/2019   AST 16 07/18/2019   ALT 27 07/18/2019   ANIONGAP 7 07/18/2019   Last lipids No results found for: CHOL, HDL, LDLCALC, LDLDIRECT, TRIG, CHOLHDL Last hemoglobin A1c No results found for: HGBA1C Last thyroid functions No results found for: TSH, T3TOTAL, T4TOTAL, THYROIDAB Last vitamin D No results found for: 25OHVITD2, 25OHVITD3, VD25OH Last vitamin  B12 and Folate No results found for: VITAMINB12, FOLATE    Objective    BP 128/84   Pulse 73   Temp (!) 97.3 F (36.3 C) (Oral)   Resp 16   Wt 188 lb 6.4 oz (85.5 kg)   SpO2 96%   BMI 29.51 kg/m  Physical Exam Constitutional:      General: He is not in acute distress.    Appearance: Normal appearance. He is not ill-appearing, toxic-appearing or diaphoretic.     Comments: Patient is alert and oriented and responsive to questions Engages in eye contact with provider. Speaks in full sentences without any pauses without any shortness of breath or distress.    HENT:     Head: Normocephalic and atraumatic.     Jaw: There is normal jaw occlusion.     Right Ear: Hearing, tympanic membrane, ear canal and external ear normal. There is no impacted cerumen.     Left Ear: Hearing, tympanic membrane, ear canal and external ear normal. There is no impacted cerumen.     Nose: Nose normal. No congestion or rhinorrhea.     Right Sinus: No maxillary sinus tenderness or frontal sinus tenderness.     Left Sinus: No maxillary sinus tenderness or frontal sinus tenderness.     Mouth/Throat:     Lips: Pink.     Mouth: Mucous membranes are moist.     Pharynx: Oropharynx is clear. Uvula midline. No oropharyngeal exudate or  posterior oropharyngeal erythema.     Tonsils: No tonsillar exudate or tonsillar abscesses.  Eyes:     General: No scleral icterus.       Right eye: No discharge.        Left eye: No discharge.     Extraocular Movements: Extraocular movements intact.     Conjunctiva/sclera: Conjunctivae normal.     Pupils: Pupils are equal, round, and reactive to light.  Neck:     Thyroid: Thyromegaly present. No thyroid mass or thyroid tenderness.     Trachea: Trachea and phonation normal.     Meningeal: Brudzinski's sign and Kernig's sign absent.  Cardiovascular:     Rate and Rhythm: Normal rate and regular rhythm.     Pulses: Normal pulses.     Heart sounds: Normal heart sounds. No murmur heard.  No friction rub. No gallop.   Pulmonary:     Effort: Pulmonary effort is normal.     Breath sounds: Normal breath sounds.  Abdominal:     General: Bowel sounds are normal. There is no distension.     Palpations: Abdomen is soft. There is no mass.     Tenderness: There is abdominal tenderness. There is no right CVA tenderness, left CVA tenderness, guarding or rebound.     Hernia: No hernia is present.    Musculoskeletal:        General: Normal range of motion.     Cervical back: Full passive range of motion without pain, normal range of motion and neck supple. No rigidity or tenderness.  Lymphadenopathy:     Cervical: No cervical adenopathy.  Skin:    General: Skin is warm and dry.     Capillary Refill: Capillary refill takes less than 2 seconds.  Neurological:     General: No focal deficit present.     Mental Status: He is alert and oriented to person, place, and time.     Gait: Gait normal.  Psychiatric:        Mood and Affect: Mood normal.  Behavior: Behavior normal.        Thought Content: Thought content normal.        Judgment: Judgment normal.     Depression Screen PHQ 2/9 Scores 09/22/2019  PHQ - 2 Score 6  PHQ- 9 Score 21   No results found for any visits on 09/22/19.   Assessment & Plan     The primary encounter diagnosis was Routine health maintenance. Diagnoses of Gastroesophageal reflux disease, unspecified whether esophagitis present, Nausea, Fatigue, unspecified type, Screening cholesterol level, Throat discomfort, and Allergic reaction, initial encounter were also pertinent to this visit.  Thyroid feels slightly enlarged, will do thyroid ultrasound.  Recommended SSRI, or other medication for anxiety depression scoring GAD score was a 6, PHQ-9 was 21, however patient declines a psychiatry or counseling or medication therapy at this time he is aware that he can reach out to the office for follow-up if he desires.  He is not suicidal or have any homicidal thoughts or now or in the past.  He has symptoms concerning of allergic response, in his mouth he has numbness and tingling of his tongue, swelling of his right cheek occasionally after foods, he is cut his diet down to just [place] and ham due to these issues and his issues with gastritis and gastroesophageal reflux disease.  I recommend allergy testing to rule out any allergic response, epinephrine pen was also prescribed should he have any anaphylaxis, reviewed symptoms of that with them and instructions for how to use the EpiPen as well is advised that they had the pharmacist show them with the test pen as well.  Call 911 if have to use EpiPen.  Is advised to continue his Nexium 40 mg daily and to follow-up with Tressia Danas gastroenterology for follow-up appointment and persistent symptoms since he is already established as a patient.  We will do baseline labs today, and have patient follow back up in 1 month. Call the office if he has not heard from referrals within 2 weeks or for imaging of thyroid. Orders Placed This Encounter  Procedures  . US THYROID  . CBC with Differential/Platelet  . Comprehensive Metabolic Panel (CMET)  . TSH  . Lipid Profile  . Amylase  . Lipase  . Hepatitis panel,  acute  . Ambulatory referral to Allergy   Return in about 1 month (around 10/22/2019), or if symptoms worsen or fail to improve, for at any time for any worsening symptoms.      Advised patient call the office or your primary care doctor for an appointment if no improvement within 72 hours or if any symptoms change or worsen at any time  Advised ER or urgent Care if after hours or on weekend. Call 911 for emergency symptoms at any time.Patinet verbalized understanding of all instructions given/reviewed and treatment plan and has no further questions or concerns at this time.      IBeverely Pace Sejal Cofield, FNP, have reviewed all documentation for this visit. The documentation on 09/22/19 for the exam, diagnosis, procedures, and orders are all accurate and complete.   Jairo Ben, FNP  [facility] [phone] (phone) [phone] (fax)  [facility]

## 2019-09-23 LAB — COMPREHENSIVE METABOLIC PANEL
ALT: 18 IU/L (ref 0–44)
AST: 14 IU/L (ref 0–40)
Albumin/Globulin Ratio: 1.7 (ref 1.2–2.2)
Albumin: 4.6 g/dL (ref 4.0–5.0)
Alkaline Phosphatase: 66 IU/L (ref 48–121)
BUN/Creatinine Ratio: 13 (ref 9–20)
BUN: 13 mg/dL (ref 6–20)
Bilirubin Total: 0.5 mg/dL (ref 0.0–1.2)
CO2: 24 mmol/L (ref 20–29)
Calcium: 9.9 mg/dL (ref 8.7–10.2)
Chloride: 100 mmol/L (ref 96–106)
Creatinine, Ser: 1.01 mg/dL (ref 0.76–1.27)
GFR calc Af Amer: 111 mL/min/{1.73_m2} (ref >59)
GFR calc non Af Amer: 96 mL/min/{1.73_m2} (ref >59)
Globulin, Total: 2.7 g/dL (ref 1.5–4.5)
Glucose: 84 mg/dL (ref 65–99)
Potassium: 4.1 mmol/L (ref 3.5–5.2)
Sodium: 139 mmol/L (ref 134–144)
Total Protein: 7.3 g/dL (ref 6.0–8.5)

## 2019-09-23 LAB — LIPID PANEL
Chol/HDL Ratio: 3.3 ratio (ref 0.0–5.0)
Cholesterol, Total: 156 mg/dL (ref 100–199)
HDL: 48 mg/dL (ref >39)
LDL Chol Calc (NIH): 96 mg/dL (ref 0–99)
Triglycerides: 61 mg/dL (ref 0–149)
VLDL Cholesterol Cal: 12 mg/dL (ref 5–40)

## 2019-09-23 LAB — HEPATITIS PANEL, ACUTE
Hep A IgM: NEGATIVE
Hep B C IgM: NEGATIVE
Hep C Virus Ab: <0.1 s/co ratio (ref 0.0–0.9)
Hepatitis B Surface Ag: NEGATIVE

## 2019-09-23 LAB — TSH: TSH: 3.83 u[IU]/mL (ref 0.450–4.500)

## 2019-09-23 LAB — AMYLASE: Amylase: 41 U/L (ref 31–110)

## 2019-09-23 LAB — LIPASE: Lipase: 60 U/L (ref 13–78)

## 2019-10-03 NOTE — Progress Notes (Signed)
CMP is within normal limits.  CBC was not drawn, if you could give him lab walk-in instructions and add an order for patient to return to the office. Thyroid TSH is within normal limits. Cholesterol is within normal limits.  Amylase and lipase are within normal limits. Acute hepatitis panel is negative for hepatitis A B and C. I do recommend that he follow back up sooner with Dr. Tressia Danas his gastroenterologist since he has continued ongoing issues with stomach pain.  He can call them to schedule an appointment since he is already a patient and if he needs another referral please let us know.  If he has any new or changing symptoms he can follow back up with [place] and also should keep follow-up appointments.

## 2019-10-04 ENCOUNTER — Telehealth: Payer: Self-pay

## 2019-10-04 ENCOUNTER — Ambulatory Visit
Admission: RE | Admit: 2019-10-04 | Discharge: 2019-10-04 | Disposition: A | Discharge status: Home / Self Care | Payer: Medicaid Other | Source: Ambulatory Visit | Attending: Adult Health | Admitting: Adult Health

## 2019-10-04 ENCOUNTER — Other Ambulatory Visit: Payer: Self-pay

## 2019-10-04 DIAGNOSIS — R070 Pain in throat: Secondary | ICD-10-CM | POA: Insufficient documentation

## 2019-10-04 NOTE — Telephone Encounter (Signed)
-----   Message from Berniece Pap, FNP sent at 10/03/2019  1:10 PM EDT ----- CMP is within normal limits.  CBC was not drawn, if you could give him lab walk-in instructions and add an order for patient to return to the office. Thyroid TSH is within normal limits. Cholesterol is within normal limits.  Amylase and lipase are within normal limits. Acute hepatitis panel is negative for hepatitis A B and C. I do recommend that he follow back up sooner with Dr. Tressia Danas his gastroenterologist since he has continued ongoing issues with stomach pain.  He can call them to schedule an appointment since he is already a patient and if he needs another referral please let us know.  If he has any new or changing symptoms he can follow back up with [place] and also should keep follow-up appointments.

## 2019-10-04 NOTE — Progress Notes (Signed)
Thyroid nodule on ultrasound. Would like him to see endocrinology and also needs repeat ultrasound of thyroid in one year recommended.  Ok to place referral to location of choice diagnoses thyroid nodule.

## 2019-10-04 NOTE — Telephone Encounter (Signed)
Left message for patient to call office back okay for [facility] triage nurse to advise. KW 

## 2019-10-04 NOTE — Telephone Encounter (Signed)
Attempted to call patient, recording said subscriber is not in service.

## 2019-10-05 ENCOUNTER — Telehealth: Payer: Self-pay

## 2019-10-05 DIAGNOSIS — E041 Nontoxic single thyroid nodule: Secondary | ICD-10-CM

## 2019-10-05 NOTE — Telephone Encounter (Signed)
Patient advised. Referral placed  

## 2019-10-05 NOTE — Telephone Encounter (Signed)
Patient advised.

## 2019-10-05 NOTE — Telephone Encounter (Signed)
-----   Message from Berniece Pap, FNP sent at 10/04/2019  9:10 PM EDT ----- Thyroid nodule on ultrasound. Would like him to see endocrinology and also needs repeat ultrasound of thyroid in one year recommended.  Ok to place referral to location of choice diagnoses thyroid nodule.

## 2019-10-23 ENCOUNTER — Ambulatory Visit (INDEPENDENT_AMBULATORY_CARE_PROVIDER_SITE_OTHER): Payer: Medicaid Other | Admitting: Adult Health

## 2019-10-23 ENCOUNTER — Encounter: Payer: Self-pay | Admitting: Adult Health

## 2019-10-23 ENCOUNTER — Other Ambulatory Visit: Payer: Self-pay

## 2019-10-23 VITALS — BP 126/82 | HR 77 | Temp 97.1°F | Resp 16 | Wt 192.8 lb

## 2019-10-23 DIAGNOSIS — E041 Nontoxic single thyroid nodule: Secondary | ICD-10-CM

## 2019-10-23 DIAGNOSIS — R21 Rash and other nonspecific skin eruption: Secondary | ICD-10-CM | POA: Diagnosis not present

## 2019-10-23 DIAGNOSIS — K219 Gastro-esophageal reflux disease without esophagitis: Secondary | ICD-10-CM

## 2019-10-23 MED ORDER — TRIAMCINOLONE ACETONIDE 0.5 % EX OINT: 1.0000 "application " | TOPICAL_OINTMENT | Freq: Two times a day (BID) | CUTANEOUS | 0 refills | Status: AC

## 2019-10-23 NOTE — Progress Notes (Signed)
Established patient visit   Patient: Raymond Knox   DOB: 1985/09/11   34 y.o. Male  MRN: [id number] Visit Date: 10/23/2019  Today's healthcare provider: Jairo Ben, FNP   Chief Complaint  Patient presents with   Gastroesophageal Reflux   Subjective    HPI  GERD, Follow up:  The patient was last seen for GERD 1 months ago. Changes made since that visit include none, patient advised to continue Nexium 40mg  and follow up with G.I.  He repots he is taking a " herb tea " from he can not recall the name but says it is a bark from tree. He reports he feels better with the tea. Aphipetergyium is the name he found on his phone.   He reports he was able to eat more spicy foods now with the tea.   He has an appointment with Gastroenterologist august 4th/ Denies any black or tarry stools, or rectal bleeding. Denies any hemoptysis. He has gained 5 lbs.   He has not been taking Nexium.   He reports poor compliance with treatment. Patient reports that he has been drinking a herbal tea a friend told him about that is made in September, patient states that he was told this would cure his symptoms He is not having side effects. .   He does have a thyroid nodule on his latest ultrasound, he has been referred to endocrinology and has an appointment August 30.  He reports he still has some reflux but he also has heat intolerance. Denies any other symptoms.  His TSH was within normal limits. Allergy testing completed allergy to dust mites, and cat, and birds.     He IS experiencing belching, dysphagia, heartburn and regurgitation of undigested food. He is NOT experiencing chest pain, choking on food, cough, deep pressure at base of neck, fullness after meals, hoarseness, laryngitis, nausea, need to clear throat frequently or nocturnal burning    Patient  denies any fever, body aches,chills, rash, chest pain, shortness of breath, nausea, vomiting, or diarrhea.   Denies  dizziness, lightheadedness, pre syncopal or syncopal episodes.     Patient Active Problem List   Diagnosis Date Noted   Rash of both hands 10/23/2019   Thyroid nodule 10/23/2019   Screening cholesterol level 09/22/2019   Nausea 09/22/2019   Fatigue 09/22/2019   Throat discomfort 09/22/2019   Allergic reaction 09/22/2019   GERD (gastroesophageal reflux disease) 02/18/2019   Social History   Tobacco Use   Smoking status: Never Smoker   Smokeless tobacco: Never Used  Vaping Use   Vaping Use: Never used  Substance Use Topics   Alcohol use: No   Drug use: No   Family History  Problem Relation Age of Onset   Colon cancer Neg Hx    Esophageal cancer Neg Hx    Rectal cancer Neg Hx    Stomach cancer Neg Hx    Allergies  Allergen Reactions   Dicyclomine Shortness Of Breath       Medications: Outpatient Medications Prior to Visit  Medication Sig   albuterol (PROVENTIL HFA;VENTOLIN HFA) 108 (90 BASE) MCG/ACT inhaler Inhale 2 puffs into the lungs every 4 (four) hours as needed for wheezing or shortness of breath. (Patient not taking: Reported on 08/24/2019)   EPINEPHrine 0.3 mg/0.3 mL IJ SOAJ injection Inject 0.3 mLs (0.3 mg total) into the muscle as needed for anaphylaxis. (Patient not taking: Reported on 10/23/2019)   esomeprazole (NEXIUM) 40 MG capsule NEXIUM 40  MG TWICE DAILY (Patient not taking: Reported on 10/23/2019)   No facility-administered medications prior to visit.    Review of Systems  Constitutional: Negative.   HENT: Negative.   Eyes: Negative.   Respiratory: Negative.   Cardiovascular: Negative.   Gastrointestinal: Negative.   Genitourinary: Negative.   Musculoskeletal: Negative.   Skin: Negative.   Neurological: Negative.   Hematological: Negative.   Psychiatric/Behavioral: Negative.     Last CBC Lab Results  Component Value Date   WBC 8.5 07/18/2019   HGB 16.4 07/18/2019   HCT 48.8 07/18/2019   MCV 88.6 07/18/2019   MCH  29.8 07/18/2019   RDW 12.7 07/18/2019   PLT 314 07/18/2019   Last metabolic panel Lab Results  Component Value Date   GLUCOSE 84 09/22/2019   NA 139 09/22/2019   K 4.1 09/22/2019   CL 100 09/22/2019   CO2 24 09/22/2019   BUN 13 09/22/2019   CREATININE 1.01 09/22/2019   GFRNONAA 96 09/22/2019   GFRAA 111 09/22/2019   CALCIUM 9.9 09/22/2019   PROT 7.3 09/22/2019   ALBUMIN 4.6 09/22/2019   LABGLOB 2.7 09/22/2019   AGRATIO 1.7 09/22/2019   BILITOT 0.5 09/22/2019   ALKPHOS 66 09/22/2019   AST 14 09/22/2019   ALT 18 09/22/2019   ANIONGAP 7 07/18/2019   Last lipids Lab Results  Component Value Date   CHOL 156 09/22/2019   HDL 48 09/22/2019   LDLCALC 96 09/22/2019   TRIG 61 09/22/2019   CHOLHDL 3.3 09/22/2019   Last hemoglobin A1c No results found for: HGBA1C Last thyroid functions Lab Results  Component Value Date   TSH 3.830 09/22/2019   Last vitamin D No results found for: 25OHVITD2, 25OHVITD3, VD25OH Last vitamin B12 and Folate No results found for: VITAMINB12, FOLATE    Objective    BP 126/82    Pulse 77    Temp (!) 97.1 F (36.2 C) (Oral)    Resp 16    Wt 192 lb 12.8 oz (87.5 kg)    SpO2 97%    BMI 30.20 kg/m  BP Readings from Last 3 Encounters:  10/23/19 126/82  09/22/19 128/84  08/29/19 122/80      Physical Exam Constitutional:      General: He is not in acute distress.    Appearance: Normal appearance. He is well-developed. He is not ill-appearing, toxic-appearing or diaphoretic.  HENT:     Head: Normocephalic and atraumatic.     Right Ear: Hearing, tympanic membrane, ear canal and external ear normal.     Left Ear: Hearing, tympanic membrane, ear canal and external ear normal.     Nose: Nose normal.     Mouth/Throat:     Lips: Pink.     Tongue: No lesions. Tongue does not deviate from midline.     Pharynx: Uvula midline. No oropharyngeal exudate.     Tonsils: No tonsillar exudate or tonsillar abscesses.  Eyes:     General: Lids are normal.  No scleral icterus.       Right eye: No discharge.        Left eye: No discharge.     Conjunctiva/sclera: Conjunctivae normal.     Pupils: Pupils are equal, round, and reactive to light.  Neck:     Thyroid: Thyromegaly present. No thyroid tenderness.     Vascular: Normal carotid pulses. No carotid bruit, hepatojugular reflux or JVD.     Trachea: Trachea and phonation normal. No tracheal tenderness or tracheal deviation.  Meningeal: Brudzinski's sign absent.  Cardiovascular:     Rate and Rhythm: Normal rate and regular rhythm.     Pulses: Normal pulses.     Heart sounds: Normal heart sounds, S1 normal and S2 normal. Heart sounds not distant. No murmur heard.  No friction rub. No gallop.   Pulmonary:     Effort: Pulmonary effort is normal. No accessory muscle usage or respiratory distress.     Breath sounds: Normal breath sounds. No stridor. No wheezing or rales.  Chest:     Chest wall: No tenderness.  Abdominal:     General: Bowel sounds are normal. There is no distension.     Palpations: Abdomen is soft. There is no mass.     Tenderness: There is no abdominal tenderness. There is no guarding or rebound.  Musculoskeletal:        General: No tenderness or deformity. Normal range of motion.     Cervical back: Full passive range of motion without pain, normal range of motion and neck supple.  Lymphadenopathy:     Head:     Right side of head: No submental, submandibular, tonsillar, preauricular, posterior auricular or occipital adenopathy.     Left side of head: No submental, submandibular, tonsillar, preauricular, posterior auricular or occipital adenopathy.     Cervical: No cervical adenopathy.     Right cervical: No superficial, deep or posterior cervical adenopathy.    Left cervical: No superficial, deep or posterior cervical adenopathy.  Skin:    General: Skin is warm and dry.     Coloration: Skin is not pale.     Findings: Erythema and rash present.     Nails: There is no  clubbing.          Comments: Palmar surfaces of hands with mild skin peeling and mild erythema.  No drainage.  No blistering.  Neurological:     Mental Status: He is alert and oriented to person, place, and time.     GCS: GCS eye subscore is 4. GCS verbal subscore is 5. GCS motor subscore is 6.     Cranial Nerves: No cranial nerve deficit.     Sensory: No sensory deficit.     Motor: No abnormal muscle tone.     Coordination: Coordination normal.     Deep Tendon Reflexes: Reflexes are normal and symmetric. Reflexes normal.  Psychiatric:        Speech: Speech normal.        Behavior: Behavior normal.        Thought Content: Thought content normal.        Judgment: Judgment normal.       No results found for any visits on 10/23/19.  Assessment & Plan      Thyroid nodule  Rash of both hands - Plan: Ambulatory referral to Dermatology  Gastroesophageal reflux disease, unspecified whether esophagitis present   Meds ordered this encounter  Medications   triamcinolone ointment (KENALOG) 0.5 %    Sig: Apply 1 application topically 2 (two) times daily. Thin layer to hands    Dispense:  30 g    Refill:  0   Advised the patient that likely overuse of hand sanitizer and meticulous washing of hands that has contributed to his dry hands, he is advised to use Eucerin lotion and also given triamcinolone to use sparingly twice a day for about 10 days. Going to the office if any symptoms are worsening. He also has a follow-up with endocrinology August 30 and  he is well has a follow-up with gastroenterology August 4.  He will go for preadmission testing for Covid on July 31.  Commend Nexium and discontinuation of the tea that he is taking as not approved treatment plan.  However patient prefers to continue his homeopathic treatment and assumes risk is benefits. Clines Nexium at this time and he will keep follow-up with his gastroenterologist.  Follow-up to discuss thyroid follow-up and GI  follow-up as well as throat.  May need referral to ENT if nothing found on endoscopy or if endocrinology does not feel further work-up is needed at this time avoid ultrasound showed that nodule was not cystic and recommended repeat of that in 1 year.  We will have endocrinology evaluate. Return in about 3 months (around 01/23/2020), or if symptoms worsen or fail to improve, for at any time for any worsening symptoms, Go to Emergency room/ urgent care if worse.      IBeverely Pace, Avanell Banwart Smith Hareem Surowiec, FNP, have reviewed all documentation for this visit. The documentation on 10/23/19 for the exam, diagnosis, procedures, and orders are all accurate and complete.   Jairo BenMichelle Smith Kennette Cuthrell, FNP  [facility] [phone] ([phone] (fax)  [facility]

## 2019-10-23 NOTE — Patient Instructions (Addendum)
Eucerin emollient lotion advised for hands after hand washing or alcohol gel.   Raymond Knox  MRN: [id number]  Patient Information  Patient Name  Raymond Knox, Raymond Knox Legal Sex  Male DOB  1985/11/15 SSN  [id number]  Encounter Information   Provider Department Encounter # Center  11/04/2019 10:10 AM [facility] [id number]   Future Appointments   Provider Department Center  11/04/2019 10:10 AM (Arrive by 9:55 AM) [facility]       Triamcinolone skin cream, ointment, lotion, or aerosol What is this medicine? TRIAMCINOLONE (trye am SIN oh lone) is a corticosteroid. It is used on the skin to reduce swelling, redness, itching, and allergic reactions. This medicine may be used for other purposes; ask your health care provider or pharmacist if you have questions. COMMON BRAND NAME(S): Aristocort, Aristocort A, Aristocort HP, Cinalog, Cinolar, DERMASORB TA Complete, Flutex, Kenalog, Pediaderm TA, Sila III, SP Rx 228, Triacet, Trianex, Triderm What should I tell my health care provider before I take this medicine? They need to know if you have any of these conditions:  any active infection  large areas of burned or damaged skin  skin wasting or thinning  an unusual or allergic reaction to triamcinolone, corticosteroids, other medicines, foods, dyes, or preservatives  pregnant or trying to get pregnant  breast-feeding How should I use this medicine? This medicine is for external use only. Do not take by mouth. Follow the directions on the prescription label. Wash your hands before and after use. Apply a thin film of medicine to the affected area. Do not cover with a bandage or dressing unless your doctor or health care professional tells you to. Do not use on healthy skin or over large areas of skin. Do not get this medicine in your eyes. If you do, rinse out with plenty of cool tap water. It is important not to use more  medicine than prescribed. Do not use your medicine more often than directed. Talk to your pediatrician regarding the use of this medicine in children. Special care may be needed. Elderly patients are more likely to have damaged skin through aging, and this may increase side effects. This medicine should only be used for brief periods and infrequently in older patients. Overdosage: If you think you have taken too much of this medicine contact a poison control center or emergency room at once. NOTE: This medicine is only for you. Do not share this medicine with others. What if I miss a dose? If you miss a dose, use it as soon as you can. If it is almost time for your next dose, use only that dose. Do not use double or extra doses. What may interact with this medicine? Interactions are not expected. This list may not describe all possible interactions. Give your health care provider a list of all the medicines, herbs, non-prescription drugs, or dietary supplements you use. Also tell them if you smoke, drink alcohol, or use illegal drugs. Some items may interact with your medicine. What should I watch for while using this medicine? Tell your doctor or health care professional if your symptoms do not start to get better within one week. Do not use for more than 14 days. Do not use on healthy skin or over large areas of skin. Tell your doctor or health care professional if you are exposed to anyone with measles or chickenpox, or if you develop sores or blisters that do not heal properly. Do not  use an airtight bandage to cover the affected area unless your doctor or health care professional tells you to. If you are to cover the area, follow the instructions carefully. Covering the area where the medicine is applied can increase the amount that passes through the skin and increases the risk of side effects. If treating the diaper area of a child, avoid covering the treated area with tight-fitting diapers or  plastic pants. This may increase the amount of medicine that passes through the skin and increase the risk of serious side effects. What side effects may I notice from receiving this medicine? Side effects that you should report to your doctor or health care professional as soon as possible:  burning or itching of the skin  dark red spots on the skin  infection  painful, red, pus filled blisters in hair follicles  thinning of the skin, sunburn more likely especially on the face Side effects that usually do not require medical attention (report to your doctor or health care professional if they continue or are bothersome):  dry skin, irritation  unusual increased growth of hair on the face or body This list may not describe all possible side effects. Call your doctor for medical advice about side effects. You may report side effects to FDA at 1-800-FDA-1088. Where should I keep my medicine? Keep out of the reach of children. Store at room temperature between 15 and 30 degrees C (59 and 86 degrees F). Do not freeze. Throw away any unused medicine after the expiration date. NOTE: This sheet is a summary. It may not cover all possible information. If you have questions about this medicine, talk to your doctor, pharmacist, or health care provider.  2020 Elsevier/Gold Standard (2017-12-23 10:50:30)  Contact Dermatitis Dermatitis is redness, soreness, and swelling (inflammation) of the skin. Contact dermatitis is a reaction to something that touches the skin. There are two types of contact dermatitis:  Irritant contact dermatitis. This happens when something bothers (irritates) your skin, like soap.  Allergic contact dermatitis. This is caused when you are exposed to something that you are allergic to, such as poison ivy. What are the causes?  Common causes of irritant contact dermatitis include: ? Makeup. ? Soaps. ? Detergents. ? Bleaches. ? Acids. ? Metals, such as  nickel.  Common causes of allergic contact dermatitis include: ? Plants. ? Chemicals. ? Jewelry. ? Latex. ? Medicines. ? Preservatives in products, such as clothing. What increases the risk?  Having a job that exposes you to things that bother your skin.  Having asthma or eczema. What are the signs or symptoms? Symptoms may happen anywhere the irritant has touched your skin. Symptoms include:  Dry or flaky skin.  Redness.  Cracks.  Itching.  Pain or a burning feeling.  Blisters.  Blood or clear fluid draining from skin cracks. With allergic contact dermatitis, swelling may occur. This may happen in places such as the eyelids, mouth, or genitals. How is this treated?  This condition is treated by checking for the cause of the reaction and protecting your skin. Treatment may also include: ? Steroid creams, ointments, or medicines. ? Antibiotic medicines or other ointments, if you have a skin infection. ? Lotion or medicines to help with itching. ? A bandage (dressing). Follow these instructions at home: Skin care  Moisturize your skin as needed.  Put cool cloths on your skin.  Put a baking soda paste on your skin. Stir water into baking soda until it looks like a paste.  Do not scratch your skin.  Avoid having things rub up against your skin.  Avoid the use of soaps, perfumes, and dyes. Medicines  Take or apply over-the-counter and prescription medicines only as told by your doctor.  If you were prescribed an antibiotic medicine, take or apply it as told by your doctor. Do not stop using it even if your condition starts to get better. Bathing  Take a bath with: ? Epsom salts. ? Baking soda. ? Colloidal oatmeal.  Bathe less often.  Bathe in warm water. Avoid using hot water. Bandage care  If you were given a bandage, change it as told by your health care provider.  Wash your hands with soap and water before and after you change your bandage. If soap  and water are not available, use hand sanitizer. General instructions  Avoid the things that caused your reaction. If you do not know what caused it, keep a journal. Write down: ? What you eat. ? What skin products you use. ? What you drink. ? What you wear in the area that has symptoms. This includes jewelry.  Check the affected areas every day for signs of infection. Check for: ? More redness, swelling, or pain. ? More fluid or blood. ? Warmth. ? Pus or a bad smell.  Keep all follow-up visits as told by your doctor. This is important. Contact a doctor if:  You do not get better with treatment.  Your condition gets worse.  You have signs of infection, such as: ? More swelling. ? Tenderness. ? More redness. ? Soreness. ? Warmth.  You have a fever.  You have new symptoms. Get help right away if:  You have a very bad headache.  You have neck pain.  Your neck is stiff.  You throw up (vomit).  You feel very sleepy.  You see red streaks coming from the area.  Your bone or joint near the area hurts after the skin has healed.  The area turns darker.  You have trouble breathing. Summary  Dermatitis is redness, soreness, and swelling of the skin.  Symptoms may occur where the irritant has touched you.  Treatment may include medicines and skin care.  If you do not know what caused your reaction, keep a journal.  Contact a doctor if your condition gets worse or you have signs of infection. This information is not intended to replace advice given to you by your health care provider. Make sure you discuss any questions you have with your health care provider. Document Revised: 07/13/2018 Document Reviewed: 10/06/2017 Elsevier Patient Education  2020 [employer].

## 2019-11-04 ENCOUNTER — Other Ambulatory Visit (HOSPITAL_COMMUNITY)
Admission: RE | Admit: 2019-11-04 | Discharge: 2019-11-04 | Disposition: A | Discharge status: Home / Self Care | Payer: Medicaid Other | Source: Ambulatory Visit | Attending: Gastroenterology | Admitting: Gastroenterology

## 2019-11-04 DIAGNOSIS — Z20822 Contact with and (suspected) exposure to covid-19: Secondary | ICD-10-CM | POA: Diagnosis present

## 2019-11-04 LAB — SARS CORONAVIRUS 2 (TAT 6-24 HRS): SARS Coronavirus 2: NEGATIVE

## 2019-11-08 ENCOUNTER — Encounter (HOSPITAL_COMMUNITY)
Admission: RE | Disposition: A | Discharge status: Home / Self Care | Payer: Self-pay | Source: Home / Self Care | Attending: Gastroenterology

## 2019-11-08 ENCOUNTER — Ambulatory Visit (HOSPITAL_COMMUNITY)
Admission: RE | Admit: 2019-11-08 | Discharge: 2019-11-08 | Disposition: A | Discharge status: Home / Self Care | Payer: Medicaid Other | Attending: Gastroenterology | Admitting: Gastroenterology

## 2019-11-08 DIAGNOSIS — K219 Gastro-esophageal reflux disease without esophagitis: Secondary | ICD-10-CM | POA: Diagnosis not present

## 2019-11-08 DIAGNOSIS — Z538 Procedure and treatment not carried out for other reasons: Secondary | ICD-10-CM | POA: Diagnosis not present

## 2019-11-08 SURGERY — CANCELLED PROCEDURE

## 2019-11-08 MED ORDER — LIDOCAINE VISCOUS HCL 2 % MT SOLN
OROMUCOSAL | Status: AC
  Filled 2019-11-08: qty 15

## 2019-11-08 SURGICAL SUPPLY — 2 items
FACESHIELD LNG OPTICON STERILE (SAFETY) IMPLANT
GLOVE BIO SURGEON STRL SZ8 (GLOVE) ×8 IMPLANT

## 2019-11-08 NOTE — Progress Notes (Signed)
Pt to room for esophageal Manometry and PH study. Pt off medicine. Place manometry probe per protocol. Pt tolerated placement of probe without difficulty. Probe was down for about 2 -3 min. Pt started to vomit water and tried to control his gag reflux but he could not. He would go several seconds and then gag again. After about 3 min patient stated he could not continue with probe down and wanted it out. Probe removed without difficulty. Pt was then fine after probe removed and stated that he just could not stop gagging with it in and that he does have an easy gag reflex. Study aborted due to above.

## 2019-12-12 ENCOUNTER — Ambulatory Visit (INDEPENDENT_AMBULATORY_CARE_PROVIDER_SITE_OTHER): Payer: Medicaid Other | Admitting: Gastroenterology

## 2019-12-12 ENCOUNTER — Encounter: Payer: Self-pay | Admitting: Gastroenterology

## 2019-12-12 VITALS — BP 130/70 | HR 100 | Ht 67.0 in | Wt 192.0 lb

## 2019-12-12 DIAGNOSIS — K219 Gastro-esophageal reflux disease without esophagitis: Secondary | ICD-10-CM

## 2019-12-12 NOTE — Progress Notes (Signed)
Referring Provider: Sharmon Leyden* Primary Care Physician:  Doreen Beam, FNP  Reason for Consultation: Chest pain, reflux   IMPRESSION:  Atypical chest pain  GERD despite PPI and H2blocker therapy x 6 months    -Mild reflux noted on esophagram Shortness of breath 60 pounds unintentional weight loss over 6 months Possible pyelonephritis noted on recent CT scan 01/2019 Step-son with H pylori  Reflux with atypical chest pain:  EGD showed gastritis and reactive esophageal changes. He stopped his PPI BID, Pepcid twice daily, and Carafate because he did not think they were helping.  He did not tolerated placement of the esophageal manometry probe. I recommended a BRAVO capsule off medications.    Shortness of breath: May be related to LPR.  However, non-GI etiologies must be considered. He is now concerned that the symptoms are due to his thyroid. I encouraged him to follow-up with NP Flinchum.   Abnormal kidney on CT scan with contrast 01/2019: Linear areas of hypoenhancement involving the upper pole of the left kidney.  Given the positional nature of his symptoms will have a low threshold to repeat his CT scan. But, this decision was deferred to his primary care provider.    PLAN: BRAVO off medications for further evaluation I asked him to discuss his abnormal kidney on CT scan with his primary care provider to determine if additional evaluation was needed  Office follow-up after the BRAVO results are available, earlier if needed   Please see the "Patient Instructions" section for addition details about the plan.  HPI: Raymond Knox is a 34 y.o. male who initially self-referred for reflux. I saw him in consultation 08/24/19 and he had an EGD 08/29/19. He returns today in scheduled follow-up. The interval history is obtained to the patient and review of his electronic health record. Previously evaluated by multiple doctors at [facility] including most recently by GI 07/24/2019.   Works for [employer], although he is having a difficult time working due to his symptoms.   Developed progressive reflux with associated dysphagia to solids and liquids in October 2020 without improvement despite Protonix 40 mg twice daily and famotidine 20 mg twice daily.  He has been seen in the ED and had multiple visits to the GI department at [facility]. EGD without biopsies wasnormal.  CT of the abd/pelvis was negative.  Abdominal ultrasound showed hepatic steatosis recent esophagram showed reflux. H pylori serum antibody was negative. On his last visit at [facility] 07/24/2019 it was recommended that he start Gaviscon.  Arrangements were made for esophagram, esophageal pH/manometry and consideration for colonoscopy plus/minus EGD with esophageal biopsies.  When I met him in May 2021, he reported near constant burning sensation in the epigastric region that radiated to his throat.  Felt this was drying his throat which results in throat closing and shortness of breath.  Pain intermittently radiates from the epigastric region down to his right lower quadrant.  Lying on his left side provides relief. Walking or other movement makes it worse.  Finds that wearing a mask only makes it worse because he feels he cannot breathe. No relief with 6 months of Protonix or Prevacid.  No odynophagia, dysphonia, neck pain, or sore throat.  No change in bowel habits.  No blood in the stool.  No history of anemia.  He had lost 60 pounds since October.   EGD 08/29/19 showed gastritis and hyperplstic gastric polyps. Esophageal biopsies showed reactive changes. There was gastropathy but no H  pylori.  Esophageal manometry and 24 hour pH probe was attempted but he was unable to tolerate the placement of the tube.   He had food allergy testing in July 2021 with Dr. 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 He has been using a herbal tea of  aphipetergyium every day with improvement in his abdominal pain. However, he continues to have difficulties with neck and his shortness of breath.  He has been able to expand his diet to include some spicy foods while using the tea and has even gained some weight.   Has been unable to work for 2-3 months due to the shortness of breath and the acid. Being in the sun triggers his symptoms. Rare SOB when sitting.  No orthopnea.  No chest pain. Cool air helps him relax and improves his symptoms.   He has been concerned about thyroid disease and sought evaluation. Thyroid ultrasound revealed a thyroid nodule. He reports having another ultrasound scheduled for tomorrow.    A summary of his recent evaluation includes: - H. pylori serum antibody was - 04/26/2019.   - HIV antigen antibody nonreactive 06/13/2019.   - EGD was performed 05/17/2019 at [facility] in February.  The results are not available.  The patient is concerned because biopsies were not obtained. - Normal CBC and comprehensive metabolic panel including liver enzymes 05/29/2019.  ALT 24. - CT of the abdomen pelvis 01/31/2019 suggested possible pyelonephritis otherwise there was no evidence of acute abdominal pelvic abnormality. - Abdominal ultrasound 06/07/2019 showed hepatic steatosis but was otherwise normal - Esophagram 08/14/2019 showed no evidence of hernia or esophageal motility disorder.  Minimal gastric reflux was noted with provocative maneuvers. - EGD 08/29/19 showed gastritis and hyperplstic gastric polyps. Esophageal biopsies showed reactive changes. There was gastropathy but no H pylori.  - Unsuccessful attempt at esophageal manometry 09/06/19 due to intolerance of the tube - Food allergy testing in July 2021 with Dr. Donneta Romberg that showed allergies to lobster and clams    Past Medical History:  Diagnosis Date  . Acid reflux   . Asthma    as a child for about 1 month-went away  . GERD (gastroesophageal reflux disease)     No past  surgical history on file.  Current Outpatient Medications  Medication Sig Dispense Refill  . esomeprazole (NEXIUM) 40 MG capsule NEXIUM 40 MG TWICE DAILY 60 capsule 1  . triamcinolone ointment (KENALOG) 0.5 % Apply 1 application topically 2 (two) times daily. Thin layer to hands 30 g 0  . EPINEPHrine 0.3 mg/0.3 mL IJ SOAJ injection Inject 0.3 mLs (0.3 mg total) into the muscle as needed for anaphylaxis. (Patient not taking: Reported on 10/23/2019) 1 each 1   No current facility-administered medications for this visit.    Allergies as of 12/12/2019 - Review Complete 12/12/2019  Allergen Reaction Noted  . Dicyclomine Shortness Of Breath 06/13/2019    Family History  Problem Relation Age of Onset  . Colon cancer Neg Hx   . Esophageal cancer Neg Hx   . Rectal cancer Neg Hx   . Stomach cancer Neg Hx     Social History   Socioeconomic History  . Marital status: Married    Spouse name: Not on file  . Number of children: Not on file  . Years of education: Not on file  . Highest education level: Not on file  Occupational History  . Occupation: Biomedical scientist  Tobacco Use  . Smoking status: Never Smoker  . Smokeless tobacco: Never Used  Vaping Use  . Vaping Use: Never used  Substance and Sexual Activity  . Alcohol use: No  . Drug use: No  . Sexual activity: Not on file  Other Topics Concern  . Not on file  Social History Narrative   ** Merged History Encounter **       Social Determinants of Health   Financial Resource Strain:   . Difficulty of Paying Living Expenses: Not on file  Food Insecurity:   . Worried About Charity fundraiser in the Last Year: Not on file  . Ran Out of Food in the Last Year: Not on file  Transportation Needs:   . Lack of Transportation (Medical): Not on file  . Lack of Transportation (Non-Medical): Not on file  Physical Activity:   . Days of Exercise per Week: Not on file  . Minutes of Exercise per Session: Not on file  Stress:   . Feeling of  Stress : Not on file  Social Connections:   . Frequency of Communication with Friends and Family: Not on file  . Frequency of Social Gatherings with Friends and Family: Not on file  . Attends Religious Services: Not on file  . Active Member of Clubs or Organizations: Not on file  . Attends Archivist Meetings: Not on file  . Marital Status: Not on file  Intimate Partner Violence:   . Fear of Current or Ex-Partner: Not on file  . Emotionally Abused: Not on file  . Physically Abused: Not on file  . Sexually Abused: Not on file    Review of Systems: 12 system ROS is negative except as noted above except for headaches and shortness of breath.   Physical Exam: General:   Alert,  well-nourished, pleasant and cooperative in NAD Head:  Normocephalic and atraumatic. Eyes:  Sclera clear, no icterus.   Conjunctiva pink. Ears:  Normal auditory acuity. Nose:  No deformity, discharge,  or lesions. Mouth:  No deformity or lesions.   Neck:  Supple; no masses or thyromegaly. Lungs:  Clear throughout to auscultation.   No wheezes. Heart:  Regular rate and rhythm; no murmurs. Abdomen:  Soft,nontender, nondistended, normal bowel sounds, no rebound or guarding. No hepatosplenomegaly.   Rectal:  Deferred  Msk:  Symmetrical. No boney deformities LAD: No inguinal or umbilical LAD Extremities:  No clubbing or edema. Neurologic:  Alert and  oriented x4;  grossly nonfocal Skin:  Intact without significant lesions or rashes. Multiple tattoos.  Psych:  Alert and cooperative. Normal mood and affect.     Bobi Daudelin L. Tarri Glenn, MD, MPH 12/12/2019, 1:36 PM

## 2019-12-12 NOTE — Patient Instructions (Signed)
It has been recommended to you by your physician that you have a(n) Bravo off medications completed. Per your request, we did not schedule the procedure(s) today. Please contact our office at [phone] by the end of year if should you decide to have the procedure completed. You will be scheduled for a pre-visit and procedure at that time.  If you are age 36 or older, your body mass index should be between 23-30. Your Body mass index is 30.07 kg/m. If this is out of the aforementioned range listed, please consider follow up with your Primary Care Provider.  If you are age 1 or younger, your body mass index should be between 19-25. Your Body mass index is 30.07 kg/m. If this is out of the aformentioned range listed, please consider follow up with your Primary Care Provider.    Thank you for choosing me and [facility].  Tressia Danas MD

## 2019-12-13 DIAGNOSIS — E041 Nontoxic single thyroid nodule: Secondary | ICD-10-CM | POA: Diagnosis not present

## 2020-01-23 ENCOUNTER — Telehealth (INDEPENDENT_AMBULATORY_CARE_PROVIDER_SITE_OTHER): Payer: Medicaid Other | Admitting: Adult Health

## 2020-01-23 ENCOUNTER — Encounter: Payer: Self-pay | Admitting: Adult Health

## 2020-01-23 DIAGNOSIS — R5383 Other fatigue: Secondary | ICD-10-CM

## 2020-01-23 DIAGNOSIS — E041 Nontoxic single thyroid nodule: Secondary | ICD-10-CM | POA: Diagnosis not present

## 2020-01-23 DIAGNOSIS — K219 Gastro-esophageal reflux disease without esophagitis: Secondary | ICD-10-CM

## 2020-01-23 DIAGNOSIS — F419 Anxiety disorder, unspecified: Secondary | ICD-10-CM

## 2020-01-23 DIAGNOSIS — R21 Rash and other nonspecific skin eruption: Secondary | ICD-10-CM

## 2020-01-23 NOTE — Patient Instructions (Signed)
Stress, Adult Stress is a normal reaction to life events. Stress is what you feel when life demands more than you are used to, or more than you think you can handle. Some stress can be useful, such as studying for a test or meeting a deadline at work. Stress that occurs too often or for too long can cause problems. It can affect your emotional health and interfere with relationships and normal daily activities. Too much stress can weaken your body's defense system (immune system) and increase your risk for physical illness. If you already have a medical problem, stress can make it worse. What are the causes? All sorts of life events can cause stress. An event that causes stress for one person may not be stressful for another person. Major life events, whether positive or negative, commonly cause stress. Examples include:  Losing a job or starting a new job.  Losing a loved one.  Moving to a new town or home.  Getting married or divorced.  Having a baby.  Getting injured or sick. Less obvious life events can also cause stress, especially if they occur day after day or in combination with each other. Examples include:  Working long hours.  Driving in traffic.  Caring for children.  Being in debt.  Being in a difficult relationship. What are the signs or symptoms? Stress can cause emotional symptoms, including:  Anxiety. This is feeling worried, afraid, on edge, overwhelmed, or out of control.  Anger, including irritation or impatience.  Depression. This is feeling sad, down, helpless, or guilty.  Trouble focusing, remembering, or making decisions. Stress can cause physical symptoms, including:  Aches and pains. These may affect your head, neck, back, stomach, or other areas of your body.  Tight muscles or a clenched jaw.  Low energy.  Trouble sleeping. Stress can cause unhealthy behaviors, including:  Eating to feel better (overeating) or skipping meals.  Working too  much or putting off tasks.  Smoking, drinking alcohol, or using drugs to feel better. How is this diagnosed? Stress is diagnosed through an assessment by your health care provider. He or she may diagnose this condition based on:  Your symptoms and any stressful life events.  Your medical history.  Tests to rule out other causes of your symptoms. Depending on your condition, your health care provider may refer you to a specialist for further evaluation. How is this treated?  Stress management techniques are the recommended treatment for stress. Medicine is not typically recommended for the treatment of stress. Techniques to reduce your reaction to stressful life events include:  Stress identification. Monitor yourself for symptoms of stress and identify what causes stress for you. These skills may help you to avoid or prepare for stressful events.  Time management. Set your priorities, keep a calendar of events, and learn to say no. Taking these actions can help you avoid making too many commitments. Techniques for coping with stress include:  Rethinking the problem. Try to think realistically about stressful events rather than ignoring them or overreacting. Try to find the positives in a stressful situation rather than focusing on the negatives.  Exercise. Physical exercise can release both physical and emotional tension. The key is to find a form of exercise that you enjoy and do it regularly.  Relaxation techniques. These relax the body and mind. The key is to find one or more that you enjoy and use the techniques regularly. Examples include: ? Meditation, deep breathing, or progressive relaxation techniques. ? Yoga or   tai chi. ? Biofeedback, mindfulness techniques, or journaling. ? Listening to music, being out in nature, or participating in other hobbies.  Practicing a healthy lifestyle. Eat a balanced diet, drink plenty of water, limit or avoid caffeine, and get plenty of  sleep.  Having a strong support network. Spend time with family, friends, or other people you enjoy being around. Express your feelings and talk things over with someone you trust. Counseling or talk therapy with a mental health professional may be helpful if you are having trouble managing stress on your own. Follow these instructions at home: Lifestyle   Avoid drugs.  Do not use any products that contain nicotine or tobacco, such as cigarettes, e-cigarettes, and chewing tobacco. If you need help quitting, ask your health care provider.  Limit alcohol intake to no more than 1 drink a day for nonpregnant women and 2 drinks a day for men. One drink equals 12 oz of beer, 5 oz of wine, or 1 oz of hard liquor  Do not use alcohol or drugs to relax.  Eat a balanced diet that includes fresh fruits and vegetables, whole grains, lean meats, fish, eggs, and beans, and low-fat dairy. Avoid processed foods and foods high in added fat, sugar, and salt.  Exercise at least 30 minutes on 5 or more days each week.  Get 7-8 hours of sleep each night. General instructions   Practice stress management techniques as discussed with your health care provider.  Drink enough fluid to keep your urine clear or pale yellow.  Take over-the-counter and prescription medicines only as told by your health care provider.  Keep all follow-up visits as told by your health care provider. This is important. Contact a health care provider if:  Your symptoms get worse.  You have new symptoms.  You feel overwhelmed by your problems and can no longer manage them on your own. Get help right away if:  You have thoughts of hurting yourself or others. If you ever feel like you may hurt yourself or others, or have thoughts about taking your own life, get help right away. You can go to your nearest emergency department or call:  Your local emergency services (911 in the [place]  A suicide crisis helpline, such as the  [place] at 931 112 9668. This is open 24 hours a day. Summary  Stress is a normal reaction to life events. It can cause problems if it happens too often or for too long.  Practicing stress management techniques is the best way to treat stress.  Counseling or talk therapy with a mental health professional may be helpful if you are having trouble managing stress on your own. This information is not intended to replace advice given to you by your health care provider. Make sure you discuss any questions you have with your health care provider. Document Revised: 10/21/2018 Document Reviewed: 05/13/2016 Elsevier Patient Education  [place]. Generalized Anxiety Disorder, Adult Generalized anxiety disorder (GAD) is a mental health disorder. People with this condition constantly worry about everyday events. Unlike normal anxiety, worry related to GAD is not triggered by a specific event. These worries also do not fade or get better with time. GAD interferes with life functions, including relationships, work, and school. GAD can vary from mild to severe. People with severe GAD can have intense waves of anxiety with physical symptoms (panic attacks). What are the causes? The exact cause of GAD is not known. What increases the risk? This condition is  more likely to develop in:  Women.  People who have a family history of anxiety disorders.  People who are very shy.  People who experience very stressful life events, such as the death of a loved one.  People who have a very stressful family environment. What are the signs or symptoms? People with GAD often worry excessively about many things in their lives, such as their health and family. They may also be overly concerned about:  Doing well at work.  Being on time.  Natural disasters.  Friendships. Physical symptoms of GAD include:  Fatigue.  Muscle tension or having muscle twitches.  Trembling  or feeling shaky.  Being easily startled.  Feeling like your heart is pounding or racing.  Feeling out of breath or like you cannot take a deep breath.  Having trouble falling asleep or staying asleep.  Sweating.  Nausea, diarrhea, or irritable bowel syndrome (IBS).  Headaches.  Trouble concentrating or remembering facts.  Restlessness.  Irritability. How is this diagnosed? Your health care provider can diagnose GAD based on your symptoms and medical history. You will also have a physical exam. The health care provider will ask specific questions about your symptoms, including how severe they are, when they started, and if they come and go. Your health care provider may ask you about your use of alcohol or drugs, including prescription medicines. Your health care provider may refer you to a mental health specialist for further evaluation. Your health care provider will do a thorough examination and may perform additional tests to rule out other possible causes of your symptoms. To be diagnosed with GAD, a person must have anxiety that:  Is out of his or her control.  Affects several different aspects of his or her life, such as work and relationships.  Causes distress that makes him or her unable to take part in normal activities.  Includes at least three physical symptoms of GAD, such as restlessness, fatigue, trouble concentrating, irritability, muscle tension, or sleep problems. Before your health care provider can confirm a diagnosis of GAD, these symptoms must be present more days than they are not, and they must last for six months or longer. How is this treated? The following therapies are usually used to treat GAD:  Medicine. Antidepressant medicine is usually prescribed for long-term daily control. Antianxiety medicines may be added in severe cases, especially when panic attacks occur.  Talk therapy (psychotherapy). Certain types of talk therapy can be helpful in  treating GAD by providing support, education, and guidance. Options include: ? Cognitive behavioral therapy (CBT). People learn coping skills and techniques to ease their anxiety. They learn to identify unrealistic or negative thoughts and behaviors and to replace them with positive ones. ? Acceptance and commitment therapy (ACT). This treatment teaches people how to be mindful as a way to cope with unwanted thoughts and feelings. ? Biofeedback. This process trains you to manage your body's response (physiological response) through breathing techniques and relaxation methods. You will work with a therapist while machines are used to monitor your physical symptoms.  Stress management techniques. These include yoga, meditation, and exercise. A mental health specialist can help determine which treatment is best for you. Some people see improvement with one type of therapy. However, other people require a combination of therapies. Follow these instructions at home:  Take over-the-counter and prescription medicines only as told by your health care provider.  Try to maintain a normal routine.  Try to anticipate stressful situations and allow  extra time to manage them.  Practice any stress management or self-calming techniques as taught by your health care provider.  Do not punish yourself for setbacks or for not making progress.  Try to recognize your accomplishments, even if they are small.  Keep all follow-up visits as told by your health care provider. This is important. Contact a health care provider if:  Your symptoms do not get better.  Your symptoms get worse.  You have signs of depression, such as: ? A persistently sad, cranky, or irritable mood. ? Loss of enjoyment in activities that used to bring you joy. ? Change in weight or eating. ? Changes in sleeping habits. ? Avoiding friends or family members. ? Loss of energy for normal tasks. ? Feelings of guilt or worthlessness. Get  help right away if:  You have serious thoughts about hurting yourself or others. If you ever feel like you may hurt yourself or others, or have thoughts about taking your own life, get help right away. You can go to your nearest emergency department or call:  Your local emergency services (911 in the [place]  A suicide crisis helpline, such as the [place] at 3674069952. This is open 24 hours a day. Summary  Generalized anxiety disorder (GAD) is a mental health disorder that involves worry that is not triggered by a specific event.  People with GAD often worry excessively about many things in their lives, such as their health and family.  GAD may cause physical symptoms such as restlessness, trouble concentrating, sleep problems, frequent sweating, nausea, diarrhea, headaches, and trembling or muscle twitching.  A mental health specialist can help determine which treatment is best for you. Some people see improvement with one type of therapy. However, other people require a combination of therapies. This information is not intended to replace advice given to you by your health care provider. Make sure you discuss any questions you have with your health care provider. Document Revised: 03/05/2017 Document Reviewed: 02/11/2016 Elsevier Patient Education  2020 [place] for Gastroesophageal Reflux Disease, Adult When you have gastroesophageal reflux disease (GERD), the foods you eat and your eating habits are very important. Choosing the right foods can help ease your discomfort. Think about working with a nutrition specialist (dietitian) to help you make good choices. What are tips for following this plan?  Meals  Choose healthy foods that are low in fat, such as fruits, vegetables, whole grains, low-fat dairy products, and lean meat, fish, and poultry.  Eat small meals often instead of 3 large meals a day. Eat your meals slowly, and in a  place where you are relaxed. Avoid bending over or lying down until 2-3 hours after eating.  Avoid eating meals 2-3 hours before bed.  Avoid drinking a lot of liquid with meals.  Cook foods using methods other than frying. Bake, grill, or broil food instead.  Avoid or limit: ? Chocolate. ? Peppermint or spearmint. ? Alcohol. ? Pepper. ? Black and decaffeinated coffee. ? Black and decaffeinated tea. ? Bubbly (carbonated) soft drinks. ? Caffeinated energy drinks and soft drinks.  Limit high-fat foods such as: ? Fatty meat or fried foods. ? Whole milk, cream, butter, or ice cream. ? Nuts and nut butters. ? Pastries, donuts, and sweets made with butter or shortening.  Avoid foods that cause symptoms. These foods may be different for everyone. Common foods that cause symptoms include: ? Tomatoes. ? Oranges, lemons, and limes. ? Peppers. ? Spicy food. ?  Onions and garlic. ? Vinegar. Lifestyle  Maintain a healthy weight. Ask your doctor what weight is healthy for you. If you need to lose weight, work with your doctor to do so safely.  Exercise for at least 30 minutes for 5 or more days each week, or as told by your doctor.  Wear loose-fitting clothes.  Do not smoke. If you need help quitting, ask your doctor.  Sleep with the head of your bed higher than your feet. Use a wedge under the mattress or blocks under the bed frame to raise the head of the bed. Summary  When you have gastroesophageal reflux disease (GERD), food and lifestyle choices are very important in easing your symptoms.  Eat small meals often instead of 3 large meals a day. Eat your meals slowly, and in a place where you are relaxed.  Limit high-fat foods such as fatty meat or fried foods.  Avoid bending over or lying down until 2-3 hours after eating.  Avoid peppermint and spearmint, caffeine, alcohol, and chocolate. This information is not intended to replace advice given to you by your health care  provider. Make sure you discuss any questions you have with your health care provider. Document Revised: 07/14/2018 Document Reviewed: 04/28/2016 Elsevier Patient Education  [place].

## 2020-01-23 NOTE — Progress Notes (Signed)
MyChart Video Visit    Virtual Visit via Video Note   This visit type was conducted due to national recommendations for restrictions regarding the COVID-19 Pandemic (e.g. social distancing) in an effort to limit this patient's exposure and mitigate transmission in our community. This patient is at least at moderate risk for complications without adequate follow up. This format is felt to be most appropriate for this patient at this time. Physical exam was limited by quality of the video and audio technology used for the visit.   Patient location: at home  Provider location: Provider: Provider's office at  [facility], [place].     I discussed the limitations of evaluation and management by telemedicine and the availability of in person appointments. The patient expressed understanding and agreed to proceed.  Patient: Raymond Knox   DOB: 1985/08/31   34 y.o. Male  MRN: [id number] Visit Date: 01/23/2020  Today's healthcare provider: Jairo Ben, FNP   No chief complaint on file.  Subjective    HPI  GERD, Follow up:  The patient was last seen for GERD 3 months ago.He reports his stomach is doing much better Changes made since that visit include none, Commend Nexium and discontinuation of the tea that he is taking as not approved treatment plan.  However patient prefers to continue his homeopathic treatment and assumes risk is benefits. declines Nexium at this time and he will keep follow-up with his gastroenterologist.   .  He reports poor compliance with treatment he takes natural pill he is taking, he has told GI and spoke with Dr. Orvan Falconer and has plan to have camera pill swallowing.  Patient states that he is still drinking his tea, he reports that Nexium has not had been taken in while.  He is not having side effects. Marland Kitchen  He IS experiencing abdominal bloating, belching, fullness after meals, heartburn, nausea and need to clear throat  frequently. He is NOT experiencing abdominal bloating, bilious reflux, deep pressure at base of neck, dysphagia, hematemesis, hoarseness, melena or nocturnal burning   He recently split from his  Wife and feels increased stress has made his symptoms worsen.Marland Kitchen He would be willing to see psychiatrist.    Saw endocrinology and needs repeat US thyroid in one year. Thyroid ultrasound reveals one right thyroid nodule, 0.7 cm, hypoechoic in the right inferior thyroid gland, not definitively cystic. Not currently meeting criteria for biopsy. Will plan to monitor, and repeat ultrasound in one year Return to clinic in 1 year, thyroid ultrasound prior HILARY Rolly Pancake, NP Repeat US thyroid advised around 10/03/2020 and follow up with endocrinology after exam.   CT abdomen/ pelvis was within normal limits.   Reviewed Gatroenterology note as follows, : EGD 08/29/19 showed gastritis and hyperplstic gastric polyps. Esophageal biopsies showed reactive changes. There was gastropathy but no H pylori.  Esophageal manometry and 24 hour pH probe was attempted but he was unable to tolerate the placement of the tube.   He had food allergy testing in July 2021 with Dr. 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 He has been using a herbal tea of aphipetergyium every day with improvement in his abdominal pain. However, he continues to have difficulties with neck and his shortness of breath.  He has been able to expand his diet to include some spicy foods while using the tea and has even gained some weight.  Patient Active Problem List   Diagnosis Date Noted  . Rash of both hands 10/23/2019  . Thyroid nodule 10/23/2019  . Screening cholesterol level 09/22/2019  . Nausea 09/22/2019  . Fatigue 09/22/2019  . Throat discomfort 09/22/2019  . Allergic reaction 09/22/2019  . Gastroesophageal reflux disease 02/18/2019   Past Medical  History:  Diagnosis Date  . Acid reflux   . Asthma    as a child for about 1 month-went away  . GERD (gastroesophageal reflux disease)    Allergies  Allergen Reactions  . Dicyclomine Shortness Of Breath      Medications: Outpatient Medications Prior to Visit  Medication Sig  . EPINEPHrine 0.3 mg/0.3 mL IJ SOAJ injection Inject 0.3 mLs (0.3 mg total) into the muscle as needed for anaphylaxis. (Patient not taking: Reported on 10/23/2019)  . esomeprazole (NEXIUM) 40 MG capsule NEXIUM 40 MG TWICE DAILY  . triamcinolone ointment (KENALOG) 0.5 % Apply 1 application topically 2 (two) times daily. Thin layer to hands   No facility-administered medications prior to visit.    Review of Systems  Constitutional: Negative.   HENT: Negative.   Eyes: Negative.   Respiratory: Negative.   Cardiovascular: Negative.   Gastrointestinal: Positive for abdominal distention. Negative for abdominal pain, anal bleeding, blood in stool, constipation, diarrhea, nausea, rectal pain and vomiting.  Genitourinary: Negative.   Musculoskeletal: Negative.   Skin: Negative.   Neurological: Negative.   Hematological: Negative.   Psychiatric/Behavioral: Negative for behavioral problems, confusion, decreased concentration, dysphoric mood, hallucinations, self-injury, sleep disturbance and suicidal ideas. The patient is nervous/anxious. The patient is not hyperactive.       Objective    There were no vitals taken for this visit.   Physical Exam   Patient is alert and oriented and responsive to questions Engages in conversation with provider. Speaks in full sentences without any pauses without any shortness of breath or distress.    Assessment & Plan     Anxiety - Plan: Ambulatory referral to Psychiatry  Rash of both hands - Plan: Ambulatory referral to Dermatology  Fatigue, unspecified type  Gastroesophageal reflux disease, unspecified whether esophagitis present  Keep follow up with Dr. Christain [place]  gastroenterology as scheduled sooner if worsens.   Increasing anxiety given his history of suicidal thoughts in past no intent will defer management to psychiatry. Recommend counseling as well.   He had a flat tire and did not make dermatology appointment and requests a new referral, he denies any worsening rash on hands since last seen.   Thyroid nodule repeat US in 1 year and keep follow up with endocrinologist as directed.  Return in about 3 months (around 04/24/2020), or if symptoms worsen or fail to improve, for at any time for any worsening symptoms, Go to Emergency room/ urgent care if worse.     I discussed the assessment and treatment plan with the patient. The patient was provided an opportunity to ask questions and all were answered. The patient agreed with the plan and demonstrated an understanding of the instructions.   The patient was advised to call back or seek an in-person evaluation if the symptoms worsen or if the condition fails to improve as anticipated.  I provided 35 minutes of non-face-to-face time during this encounter.  Red Flags discussed. The patient was given clear instructions to go to ER or return to medical center if any red flags develop, symptoms do not improve, worsen or new problems develop. They verbalized understanding.   Jairo Ben, FNP  [place] [phone] (phone) [phone] (fax)  [place]

## 2020-01-23 NOTE — Progress Notes (Deleted)
     Established patient visit   Patient: Raymond Knox   DOB: 1985/04/16   34 y.o. Male  MRN: [id number] Visit Date: 01/23/2020  Today's healthcare provider: Jairo Ben, FNP   No chief complaint on file.  Subjective    HPI  GERD, Follow up:  The patient was last seen for GERD 3 months ago. Changes made since that visit include none, Commend Nexium and discontinuation of the tea that he is taking as not approved treatment plan.  However patient prefers to continue his homeopathic treatment and assumes risk is benefits. Clines Nexium at this time and he will keep follow-up with his gastroenterologist.   .  He reports {excellent/good/fair/poor:19665} compliance with treatment. He {is/is not:21021397} having side effects. ***.  He IS experiencing {Sx; ge reflux:19417}. He is NOT experiencing {Sx; ge reflux:19417:o}  -----------------------------------------------------------------------------------------   {Show patient history (optional):23778::" "}   Medications: Outpatient Medications Prior to Visit  Medication Sig  . EPINEPHrine 0.3 mg/0.3 mL IJ SOAJ injection Inject 0.3 mLs (0.3 mg total) into the muscle as needed for anaphylaxis. (Patient not taking: Reported on 10/23/2019)  . esomeprazole (NEXIUM) 40 MG capsule NEXIUM 40 MG TWICE DAILY  . triamcinolone ointment (KENALOG) 0.5 % Apply 1 application topically 2 (two) times daily. Thin layer to hands   No facility-administered medications prior to visit.    Review of Systems  {Heme  Chem  Endocrine  Serology  Results Review (optional):23779::" "}  Objective    There were no vitals taken for this visit. {Show previous vital signs (optional):23777::" "}  Physical Exam  ***  No results found for any visits on 01/23/20.  Assessment & Plan     ***  No follow-ups on file.      {provider attestation***:1}   Jairo Ben, FNP  [facility] [phone]  (phone) [phone] (fax)  [facility]

## 2020-02-14 ENCOUNTER — Telehealth: Payer: Self-pay

## 2020-02-14 NOTE — Telephone Encounter (Signed)
Copied from CRM [id number]. Topic: General - Other >> Feb 14, 2020 10:18 AM Tamela Oddi wrote: Reason for CRM: Patient would like to talk to the doctor regarding a referral for a chiropractor.  CB# [phone]

## 2020-02-16 NOTE — Telephone Encounter (Signed)
Patient states referral dept has been in touch with him for psychiatry referral and he reports that he has an appt in January. KW

## 2020-03-05 DIAGNOSIS — B353 Tinea pedis: Secondary | ICD-10-CM | POA: Diagnosis not present

## 2020-04-16 ENCOUNTER — Ambulatory Visit (INDEPENDENT_AMBULATORY_CARE_PROVIDER_SITE_OTHER): Payer: Medicaid Other | Admitting: Licensed Clinical Social Worker

## 2020-04-16 ENCOUNTER — Other Ambulatory Visit: Payer: Self-pay

## 2020-04-16 DIAGNOSIS — F321 Major depressive disorder, single episode, moderate: Secondary | ICD-10-CM

## 2020-04-16 DIAGNOSIS — F411 Generalized anxiety disorder: Secondary | ICD-10-CM

## 2020-04-16 NOTE — Progress Notes (Signed)
Comprehensive Clinical Assessment (CCA) Note  04/16/2020 Raymond Knox [id number]  Chief Complaint:  Chief Complaint  Patient presents with  . Depression  . Anxiety   Visit Diagnosis: Major depression and GAD   Client is a 35 year old male. Client is referred by PCP  for a Depression and anxiety.   Client states mental health symptoms as evidenced by:     Depression Change in energy/activity; Fatigue; Difficulty Concentrating Change in energy/activity; Fatigue; Difficulty Concentrating  Duration of Depressive Symptoms Greater than two weeks Greater than two weeks      Anxiety Worrying; Tension; Difficulty concentrating      Client denies suicidal and homicidal ideations at this time.   Client denies hallucinations and delusions at this time.  Client was screened for the following SDOH: financials, stress/tnesion, social interactions, and depression.   Assessment Information that integrates subjective and objective details with a therapist's professional interpretation:    Pt was alert and oriented x 5. He was dressed casually and engaged in session. Pt presented today with a depressed and anxious mood/affect. He was cooperative and maintained good eye contact.   Pt presents today with a limited Hx for mental health. He states that he suffers from grief and loss of his sister this past summer. After her passing, pt had increased symptoms for depression and anxiety as evidence by symptoms that are listed above. He does not have a Hx with mental health medication and would prefer not to start them at this time. After pt started to exhibit depression and anxiety symptoms, his spouse left him. She stated that he could have the kids which was pt only request. He does not have much contact with his separated spouse other than the few time she takes the kids which is less than monthly.  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 Outside of work and his kids Raymond Knox  enjoy working on cars. He states he has good informal support through family and friends. Raymond Knox denies suicidal and homicidal ideations.     Client meets criteria for: Major depression and GAD Client states use of the following substances: None reported   Goals: Elevate mood and show evidence of usual energy, activities, and socialization level.; Renew typical interest in academic achievement, social involvement, and eating patterns as well as occasional expressions of joy and zest for life.; Reduce irritability and increase normal social interaction with family and friends.; Develop healthy interpersonal relationships that lead to alleviation and help prevent the relapse of depression symptoms; Appropriately grieve the loss in order to normalize mood and to return to previous adaptive level of functioning.; Develop the ability to recognize, accept, and cope with feelings of depression. Verbally identify, if possible, the source of depressed mood; Report awareness of anger toward spouse for leaving; Begin to experience sadness in session while discussing the disappointment related to the loss or pain from the past; Engage in physical and recreational activities that reflect increased energy and interest; Verbalize any unresolved grief issues that may be contributing to depression; Describe the signs and symptoms of depression that are experienced; Learn and implement calming skills to reduce overall tension and moments of increased anxiety, attention, or arousal; Learn and implement personal skills for managing stress, solving daily problems, and resolving conflicts effectively    Objective Ask the client to make a list of what he/she is depressed about and process it with their therapist, Ask client to write a letter to lost love one  regarding feelings of loss, anger, guilt, and so forth, and share that letter to receive feedback. Assist in developing awareness of cognitive messages that reinforce  hopelessness and helplessness, Encourage sharing feelings of depression in order to clarify them and gain insight as to causes  Treatment recommendations are include plan: Pt would like to create coping skills to help with his grief/loss of his sister and also start to process through his spouse for leaving     Client provided information on ______ (enter any community resources client is linked to such as local low-cost PCP, transportation resource, etc). (IF NEEDED)  Clinician assisted client with scheduling the following appointments: _______. Clinician details of appointment.    Client was in agreement with treatment recommendations.  CCA Screening, Triage and Referral (STR)  Patient Reported Information Referral name: PCP  Whom do you see for routine medical problems? Primary Care  Practice/Facility Name: [facility] Fuller[facility]le Flinchum NP  Practice/Facility Phone Number: [phone]  What Do You Feel Would Help You the Most Today? Therapy; Medication   Have You Recently Been in Any Inpatient Treatment (Hospital/Detox/Crisis Center/28-Day Program)? No   Have You [facility]? Yes  Who [facility]? PCP   Have You Recently Had Any Thoughts About Hurting Yourself? No  Are You Planning to Commit Suicide/Harm Yourself At This time? No   Have you Recently Had Thoughts About Hurting Someone Karolee Ohslse? No   Have You Used Any Alcohol or Drugs in the Past 24 Hours? No  Do You Currently Have a Therapist/Psychiatrist? No (Therpist only does not want medication mgmt)   Have You Been Recently Discharged From Any Office Practice or Programs? No     CCA Screening Triage Referral Assessment Type of Contact: Face-to-Face   Patient Reported Information Reviewed? Yes  Is CPS involved or ever been involved? Never  Is APS involved or ever been involved? Never   [facility]   Does Patient Present under  Involuntary Commitment? No  [place]     CCA Biopsychosocial Intake/Chief Complaint:  depression and anxiety  Current Symptoms/Problems: isolation, rapid thoughts, stress, tension,   Patient Reported Schizophrenia/Schizoaffective Diagnosis in Past: No   Abilities: working on cars   Type of Services Patient Feels are Needed: therapy   Initial Clinical Notes/Concerns: anxiety   Mental Health Symptoms Depression:  Change in energy/activity; Fatigue; Difficulty Concentrating   Duration of Depressive symptoms: Greater than two weeks   Mania:  No data recorded  Anxiety:   Worrying; Tension; Difficulty concentrating   Psychosis:  None   Duration of Psychotic symptoms: No data recorded  Trauma:  None   Obsessions:  None   Compulsions:  None   Inattention:  None   Hyperactivity/Impulsivity:  N/A   Oppositional/Defiant Behaviors:  N/A   Emotional Irregularity:  N/A   Other Mood/Personality Symptoms:  No data recorded   Mental Status Exam Appearance and self-care  Stature:  Average   Weight:  Average weight   Clothing:  Casual   Grooming:  No data recorded  Cosmetic use:  Age appropriate   Posture/gait:  Normal   Motor activity:  Not Remarkable   Sensorium  Attention:  Normal   Concentration:  Normal   Orientation:  X5   Recall/memory:  Normal   Affect and Mood  Affect:  Anxious; Flat; Depressed   Mood:  Anxious; Depressed   Relating  Eye contact:  Normal   Facial expression:  Anxious   Attitude toward examiner:  Cooperative   Thought and Language  Speech flow: Clear and Coherent   Thought content:  Appropriate to Mood and Circumstances   Preoccupation:  No data recorded  Hallucinations:  No data recorded  Organization:  No data recorded  [employer] of Knowledge:  Average   Intelligence:  Average   Abstraction:  Functional   Judgement:  No data recorded  Reality Testing:  No data recorded   Insight:  Fair   Decision Making:  Normal   Social Functioning  Social Maturity:  Isolates   Social Judgement:  No data recorded  Stress  Stressors:  Grief/losses; Relationship   Coping Ability:  Exhausted   Skill Deficits:  No data recorded  Supports:  Friends/Service system; Family     Religion: Religion/Spirituality Are You A Religious Person?: Yes What is Your Religious Affiliation?: Catholic  Leisure/Recreation: Leisure / Recreation Do You Have Hobbies?: Yes Leisure and Hobbies: working on cars, walkin/hiking  Exercise/Diet: Exercise/Diet Do You Exercise?: Yes What Type of Exercise Do You Do?: Weight Training How Many Times a Week Do You Exercise?: 1-3 times a week Have You Gained or Lost A Significant Amount of Weight in the Past Six Months?: Yes-Lost Do You Follow a Special Diet?: Yes Do You Have Any Trouble Sleeping?: No   CCA Employment/Education Employment/Work Situation: Employment / Work Situation Employment situation: Employed Where is patient currently employed?: [employer] long has patient been employed?: 1 year Patient's job has been impacted by current illness: No What is the longest time patient has a held a job?: 12 years Where was the patient employed at that time?: [employer] Has patient ever been in the [employer]?: No  Education: Education Is Patient Currently Attending School?: No Last Grade Completed: 10 Did Garment/textile technologist From [employer]?: No Did You Product manager?: No Did Designer, television/film set?: No Did You Have An Individualized Education Program (IIEP): No Did You Have Any Difficulty At [employer]?: No Patient's Education Has Been Impacted by Current Illness: No   CCA Family/Childhood History Family and Relationship History: Family history Marital status: Separated Separated, when?: Sept 2021 What types of issues is patient dealing with in the relationship?: wife, left pt due to his anxiety and  depression Are you sexually active?: Yes What is your sexual orientation?: hetrosexual Does patient have children?: Yes How many children?: 2 How is patient's relationship with their children?: good relatioship  Childhood History:  Childhood History By whom was/is the patient raised?: Both parents Description of patient's relationship with caregiver when they were a child: good Patient's description of current relationship with people who raised him/her: good Does patient have siblings?: Yes Number of Siblings: 2 Description of patient's current relationship with siblings: good Did patient suffer any verbal/emotional/physical/sexual abuse as a child?: No Did patient suffer from severe childhood neglect?: No Has patient ever been sexually abused/assaulted/raped as an adolescent or adult?: No Was the patient ever a victim of a crime or a disaster?: No Witnessed domestic violence?: No Has patient been affected by domestic violence as an adult?: No  Child/Adolescent Assessment:     CCA Substance Use Alcohol/Drug Use: Alcohol / Drug Use History of alcohol / drug use?: No history of alcohol / drug abuse   DSM5 Diagnoses: Patient Active Problem List   Diagnosis Date Noted  . Rash of both hands 10/23/2019  . Thyroid nodule 10/23/2019  . Screening cholesterol level 09/22/2019  . Nausea 09/22/2019  .  Fatigue 09/22/2019  . Throat discomfort 09/22/2019  . Allergic reaction 09/22/2019  . Gastroesophageal reflux disease 02/18/2019    Patient Centered Plan: Patient is on the following Treatment Plan(s):  Depression   Referrals to Alternative Service(s): Referred to Alternative Service(s):   Place:   Date:   Time:    Referred to Alternative Service(s):   Place:   Date:   Time:    Referred to Alternative Service(s):   Place:   Date:   Time:    Referred to Alternative Service(s):   Place:   Date:   Time:     Weber Cooks, LCSW

## 2020-05-27 ENCOUNTER — Other Ambulatory Visit: Payer: Self-pay

## 2020-05-27 ENCOUNTER — Ambulatory Visit (INDEPENDENT_AMBULATORY_CARE_PROVIDER_SITE_OTHER): Payer: Medicaid Other | Admitting: Licensed Clinical Social Worker

## 2020-05-27 DIAGNOSIS — F411 Generalized anxiety disorder: Secondary | ICD-10-CM

## 2020-05-27 DIAGNOSIS — F321 Major depressive disorder, single episode, moderate: Secondary | ICD-10-CM | POA: Diagnosis not present

## 2020-05-27 NOTE — Progress Notes (Signed)
   THERAPIST PROGRESS NOTE  Session Time: 87   Therapist Response:    Subjective/Objective:  Pt was alert and oriented x 5. He was dressed casually and engaged well. Pt presented with depressed mood. Tayvon was cooperative and maintained good eye contact.   Pt primary stressor is family conflict. He has separated from his spouse a few months ago with limited contact. Amardeep currently has his 2 children full time. Pt does have good family support from his sister who he lives with. Sister takes care of the kids while Curly is at work. Keoni states that his worry and tension has been his two worst symptoms.    Plan: LCSW utilized supportive and CB therapy in today's session. Pt plan is to work out 3 x per week, go to the park 1 x per week, journal or mediate 1 x per week, and decrease PHQ-9 and GAD-7 below a 10. Pt has been referred to psychiatrist for medication management.    Assessment: Pt endorses symptoms for hopelessness, worthlessness, tension, worry, and restlessness. He does meet criteria for GAD and MDD. Pt will f/u with LCSW in 4 weeks.  Participation Level: Active  Behavioral Response: CasualAlertAnxious and Depressed  Type of Therapy: Individual Therapy  Treatment Goals addressed: Anxiety and Diagnosis: Major depresion   Interventions: CBT and Supportive  Summary: SHRIYANS KUENZI is a 35 y.o. male who presents with Major depression and GAD.   Suicidal/Homicidal: NAwithout intent/plan    Plan: Return again in 4 weeks.     Weber Cooks, LCSW 05/27/2020

## 2020-06-18 ENCOUNTER — Ambulatory Visit (HOSPITAL_COMMUNITY): Payer: Self-pay | Admitting: Licensed Clinical Social Worker

## 2020-07-09 ENCOUNTER — Ambulatory Visit (HOSPITAL_COMMUNITY): Payer: Medicaid Other | Admitting: Family

## 2020-07-11 ENCOUNTER — Ambulatory Visit (HOSPITAL_COMMUNITY): Payer: Medicaid Other | Admitting: Physician Assistant

## 2020-07-16 ENCOUNTER — Ambulatory Visit (HOSPITAL_COMMUNITY): Payer: Medicaid Other | Admitting: Licensed Clinical Social Worker

## 2020-08-20 ENCOUNTER — Other Ambulatory Visit: Payer: Self-pay

## 2020-08-20 ENCOUNTER — Ambulatory Visit (HOSPITAL_COMMUNITY): Payer: Medicaid Other | Admitting: Licensed Clinical Social Worker

## 2020-09-03 ENCOUNTER — Ambulatory Visit (HOSPITAL_COMMUNITY): Payer: Medicaid Other | Admitting: Physician Assistant

## 2020-09-13 ENCOUNTER — Ambulatory Visit (HOSPITAL_COMMUNITY): Payer: Medicaid Other | Admitting: Physician Assistant

## 2021-04-29 ENCOUNTER — Ambulatory Visit (INDEPENDENT_AMBULATORY_CARE_PROVIDER_SITE_OTHER): Payer: Medicaid Other | Admitting: Physician Assistant

## 2021-04-29 ENCOUNTER — Encounter: Payer: Self-pay | Admitting: Physician Assistant

## 2021-04-29 DIAGNOSIS — F411 Generalized anxiety disorder: Secondary | ICD-10-CM | POA: Diagnosis not present

## 2021-04-29 DIAGNOSIS — K219 Gastro-esophageal reflux disease without esophagitis: Secondary | ICD-10-CM

## 2021-04-29 MED ORDER — ESCITALOPRAM OXALATE 10 MG PO TABS: 10.0000 mg | ORAL_TABLET | Freq: Every day | ORAL | 1 refills | Status: DC

## 2021-04-29 NOTE — Assessment & Plan Note (Signed)
Refer back to GI as there does seem to be a component of acid reflux. In the meantime advised mylanta OTC, as PPIs and TUMS do not bring relief.

## 2021-04-29 NOTE — Assessment & Plan Note (Signed)
Either exacerbated by the physical symptoms of GERD or vice versa.  Discussion on how anxiety can manifest physically, how it can affect many aspects of day to day life. Pt previously saw a Child psychotherapist for counseling, but states they did not get along well. Discussed a trial of medication, lexapro, explained possible SE and incidence of SI. Pt understands and will be communicative.  F/u 6-8 weeks

## 2021-04-29 NOTE — Progress Notes (Signed)
Virtual telephone visit    Virtual Visit via Telephone Note   This visit type was conducted due to national recommendations for restrictions regarding the COVID-19 Pandemic (e.g. social distancing) in an effort to limit this patient's exposure and mitigate transmission in our community. Due to his co-morbid illnesses, this patient is at least at moderate risk for complications without adequate follow up. This format is felt to be most appropriate for this patient at this time. The patient did not have access to video technology or had technical difficulties with video requiring transitioning to audio format only (telephone). Physical exam was limited to content and character of the telephone converstion.    Patient location: home Provider location: [facility]  I discussed the limitations of evaluation and management by telemedicine and the availability of in person appointments. The patient expressed understanding and agreed to proceed.   Visit Date: 04/29/2021  Today's healthcare provider: Mikey Kirschner, PA-C   Cc. GERD, anxiety  Subjective     Raymond Knox is a 36 y/o male who presents today to discuss symptoms of acid reflux and anxiety. He states he has always dealt with reflux, in the past has had a full w/u with GI, with no significant findings aside from gastritis, but has not felt relief from any GERD medication. He eventually stopped taking them as they never worked. He pays attention to his diet and does have some trigger foods, but also reports it is unpredictable.  Describes heartburn/reflux as feeling the regurgitation, like his throat is sore and feeling like it may close. He can taste it on the back of his tongue.  When he starts feeling it, it causes him anxiety, and sometimes the sensation of his throat closing will cause him to feel like he is 'running out of air' 'not getting enough oxygen' and this can progress to dizziness. This happens at work the most often, but has also  happened when he is out walking or relaxing. This sensation passes when he is able to lay or sit down and relax and breathe.   Sometimes he feels his heart races as well. He admits to not wanting to go and hang out with friends, doesn't want to be bothered by other people. Feels annoyed or irritated at others often.  He fell off follow up with GI, their next plan was to complete a BRAVO procedure, but they never called to schedule .  Medications: Outpatient Medications Prior to Visit  Medication Sig   EPINEPHrine 0.3 mg/0.3 mL IJ SOAJ injection Inject 0.3 mLs (0.3 mg total) into the muscle as needed for anaphylaxis.   esomeprazole (NEXIUM) 40 MG capsule NEXIUM 40 MG TWICE DAILY   triamcinolone ointment (KENALOG) 0.5 % Apply 1 application topically 2 (two) times daily. Thin layer to hands   No facility-administered medications prior to visit.    Review of Systems  Constitutional: Negative.   HENT:  Positive for sore throat and trouble swallowing.   Respiratory:  Positive for choking. Negative for apnea, cough, chest tightness, shortness of breath, wheezing and stridor.   Cardiovascular: Negative.   Gastrointestinal:  Positive for abdominal pain. Negative for abdominal distention, anal bleeding, blood in stool, constipation, diarrhea, nausea, rectal pain and vomiting.  Neurological:  Positive for headaches.  Psychiatric/Behavioral:  Negative for decreased concentration, dysphoric mood, self-injury, sleep disturbance and suicidal ideas. The patient is nervous/anxious.      Objective    There were no vitals taken for this visit.     Assessment &  Plan     Problem List Items Addressed This Visit       Digestive   Gastroesophageal reflux disease    Refer back to GI as there does seem to be a component of acid reflux. In the meantime advised mylanta OTC, as PPIs and TUMS do not bring relief.      Relevant Orders   Ambulatory referral to Gastroenterology     Other   GAD  (generalized anxiety disorder) - Primary    Either exacerbated by the physical symptoms of GERD or vice versa.  Discussion on how anxiety can manifest physically, how it can affect many aspects of day to day life. Pt previously saw a Education officer, museum for counseling, but states they did not get along well. Discussed a trial of medication, lexapro, explained possible SE and incidence of SI. Pt understands and will be communicative.  F/u 6-8 weeks      Relevant Medications   escitalopram (LEXAPRO) 10 MG tablet     Return in about 6 weeks (around 06/10/2021) for anxiety. Encouraged in person or video visit.    I discussed the assessment and treatment plan with the patient. The patient was provided an opportunity to ask questions and all were answered. The patient agreed with the plan and demonstrated an understanding of the instructions.   The patient was advised to call back or seek an in-person evaluation if the symptoms worsen or if the condition fails to improve as anticipated.  I provided 25 minutes of non-face-to-face time during this encounter.   I,Laura E Walsh,acting as a Education administrator for [employer], PA-C.,have documented all relevant documentation on the behalf of Mikey Kirschner, PA-C,as directed by  Mikey Kirschner, PA-C while in the presence of Mikey Kirschner, PA-C. I, Mikey Kirschner, PA-C have reviewed all documentation for this visit. The documentation on  04/29/2021 for the exam, diagnosis, procedures, and orders are all accurate and complete.   Mikey Kirschner, PA-C [facility] [phone] (phone) [phone] (fax)  [place]

## 2021-07-08 ENCOUNTER — Ambulatory Visit: Payer: Medicaid Other | Admitting: Nurse Practitioner

## 2021-09-25 ENCOUNTER — Ambulatory Visit: Payer: Medicaid Other | Admitting: Gastroenterology

## 2021-09-25 NOTE — Progress Notes (Deleted)
Referring Provider: Mikey Kirschner, PA-C Primary Care Physician:  Mikey Kirschner, PA-C  Reason for Consultation: Chest pain, reflux   IMPRESSION:  Atypical chest pain  GERD despite PPI and H2blocker therapy x 6 months    -Mild reflux noted on esophagram Shortness of breath 60 pounds unintentional weight loss over 6 months Possible pyelonephritis noted on recent CT scan 01/2019 Step-son with H pylori  Reflux with atypical chest pain:  EGD showed gastritis and reactive esophageal changes. He stopped his PPI BID, Pepcid twice daily, and Carafate because he did not think they were helping.  He did not tolerated placement of the esophageal manometry probe. I recommended a BRAVO capsule off medications.    Shortness of breath: May be related to LPR.  However, non-GI etiologies must be considered. He is now concerned that the symptoms are due to his thyroid. I encouraged him to follow-up with NP Flinchum.   Abnormal kidney on CT scan with contrast 01/2019: Linear areas of hypoenhancement involving the upper pole of the left kidney.  Given the positional nature of his symptoms will have a low threshold to repeat his CT scan. But, this decision was deferred to his primary care provider.    PLAN: BRAVO off medications for further evaluation I asked him to discuss his abnormal kidney on CT scan with his primary care provider to determine if additional evaluation was needed  Office follow-up after the BRAVO results are available, earlier if needed   Please see the "Patient Instructions" section for addition details about the plan.  HPI: Raymond Knox is a 36 y.o. male who initially self-referred for reflux. I saw him in consultation 08/24/19 and he had an EGD 08/29/19.  He was last seen in the office 12/12/2019.  He is seen today on referral of PA Eliot Ford for further evaluation of reflux.   He returns today in scheduled follow-up. The interval history is obtained to the patient and  review of his electronic health record. Previously evaluated by multiple doctors at [facility] including most recently by GI 07/24/2019.  Works for [employer], although he is having a difficult time working due to his symptoms.   Developed progressive reflux with associated dysphagia to solids and liquids in October 2020 without improvement despite Protonix 40 mg twice daily and famotidine 20 mg twice daily.  He has been seen in the ED and had multiple visits to the GI department at [facility]. EGD without biopsies wasnormal.  CT of the abd/pelvis was negative.  Abdominal ultrasound showed hepatic steatosis recent esophagram showed reflux. H pylori serum antibody was negative. On his last visit at [facility] 07/24/2019 it was recommended that he start Gaviscon.  Arrangements were made for esophagram, esophageal pH/manometry and consideration for colonoscopy plus/minus EGD with esophageal biopsies.  When I met him in May 2021, he reported near constant burning sensation in the epigastric region that radiated to his throat.  Felt this was drying his throat which results in throat closing and shortness of breath.  Pain intermittently radiates from the epigastric region down to his right lower quadrant.  Lying on his left side provides relief. Walking or other movement makes it worse.  Finds that wearing a mask only makes it worse because he feels he cannot breathe. No relief with 6 months of Protonix or Prevacid.  No odynophagia, dysphonia, neck pain, or sore throat.  No change in bowel habits.  No blood in the stool.  No history of anemia.  He had lost  60 pounds since October.   EGD 08/29/19 showed gastritis and hyperplstic gastric polyps. Esophageal biopsies showed reactive changes. There was gastropathy but no H pylori.  Esophageal manometry and 24 hour pH probe was attempted but he was unable to tolerate the placement of the tube.   He had food allergy testing in July 2021 with Dr. Donneta Romberg that showed allergies to  lobster, clams  He stopped using his PPI and H2Blocker as he didn't feel they were helping. He has been using a herbal tea of aphipetergyium every day with improvement in his abdominal pain. However, he continues to have difficulties with neck and his shortness of breath.  He has been able to expand his diet to include some spicy foods while using the tea and has even gained some weight.   Has been unable to work for 2-3 months due to the shortness of breath and the acid. Being in the sun triggers his symptoms. Rare SOB when sitting.  No orthopnea.  No chest pain. Cool air helps him relax and improves his symptoms.   He has been concerned about thyroid disease and sought evaluation. Thyroid ultrasound revealed a thyroid nodule. He reports having another ultrasound scheduled for tomorrow.    A summary of his recent evaluation includes: - H. pylori serum antibody was - 04/26/2019.   - HIV antigen antibody nonreactive 06/13/2019.   - EGD was performed 05/17/2019 at [facility] in February.  The results are not available.  The patient is concerned because biopsies were not obtained. - Normal CBC and comprehensive metabolic panel including liver enzymes 05/29/2019.  ALT 24. - CT of the abdomen pelvis 01/31/2019 suggested possible pyelonephritis otherwise there was no evidence of acute abdominal pelvic abnormality. - Abdominal ultrasound 06/07/2019 showed hepatic steatosis but was otherwise normal - Esophagram 08/14/2019 showed no evidence of hernia or esophageal motility disorder.  Minimal gastric reflux was noted with provocative maneuvers. - EGD 08/29/19 showed gastritis and hyperplstic gastric polyps. Esophageal biopsies showed reactive changes. There was gastropathy but no H pylori.  - Unsuccessful attempt at esophageal manometry 09/06/19 due to intolerance of the tube - Food allergy testing in July 2021 with Dr. Donneta Romberg that showed allergies to lobster and clams    Past Medical History:  Diagnosis Date    Acid reflux    Asthma    as a child for about 1 month-went away   GERD (gastroesophageal reflux disease)     No past surgical history on file.  Current Outpatient Medications  Medication Sig Dispense Refill   EPINEPHrine 0.3 mg/0.3 mL IJ SOAJ injection Inject 0.3 mLs (0.3 mg total) into the muscle as needed for anaphylaxis. 1 each 1   escitalopram (LEXAPRO) 10 MG tablet Take 1 tablet (10 mg total) by mouth daily. 90 tablet 1   esomeprazole (NEXIUM) 40 MG capsule NEXIUM 40 MG TWICE DAILY 60 capsule 1   triamcinolone ointment (KENALOG) 0.5 % Apply 1 application topically 2 (two) times daily. Thin layer to hands 30 g 0   No current facility-administered medications for this visit.      Physical Exam: General:   Alert,  well-nourished, pleasant and cooperative in NAD Head:  Normocephalic and atraumatic. Eyes:  Sclera clear, no icterus.   Conjunctiva pink. Abdomen:  Soft,nontender, nondistended, normal bowel sounds, no rebound or guarding. No hepatosplenomegaly.   Neurologic:  Alert and  oriented x4;  grossly nonfocal Skin:  Intact without significant lesions or rashes. Multiple tattoos.  Psych:  Alert and cooperative. Normal mood  and affect.     Sira Adsit L. Tarri Glenn, MD, MPH 09/25/2021, 1:56 PM

## 2021-11-23 ENCOUNTER — Other Ambulatory Visit: Payer: Self-pay | Admitting: Physician Assistant

## 2021-11-23 DIAGNOSIS — F411 Generalized anxiety disorder: Secondary | ICD-10-CM

## 2021-11-24 ENCOUNTER — Other Ambulatory Visit: Payer: Self-pay | Admitting: Physician Assistant

## 2021-11-24 ENCOUNTER — Other Ambulatory Visit (HOSPITAL_COMMUNITY): Payer: Self-pay

## 2021-11-24 DIAGNOSIS — F411 Generalized anxiety disorder: Secondary | ICD-10-CM

## 2021-11-24 MED ORDER — ESCITALOPRAM OXALATE 10 MG PO TABS
10.0000 mg | ORAL_TABLET | Freq: Every day | ORAL | 1 refills | Status: AC
  Filled 2021-11-24: qty 90, 90d supply, fill #0

## 2021-11-29 ENCOUNTER — Other Ambulatory Visit (HOSPITAL_COMMUNITY): Payer: Self-pay

## 2021-12-01 ENCOUNTER — Other Ambulatory Visit (HOSPITAL_COMMUNITY): Payer: Self-pay

## 2021-12-31 IMAGING — CT CT ABD-PELV W/ CM
2 of 4 series · 17 of 46 positions shown, 19 images · IV contrast (omnipaque)
Comparison: None.

CLINICAL DATA: Right lower quadrant abdominal pain.

EXAM:
CT ABDOMEN AND PELVIS WITH CONTRAST
TECHNIQUE: Multidetector CT imaging of the abdomen and pelvis was performed
using the standard protocol following bolus administration of
intravenous contrast.
CONTRAST:  100mL OMNIPAQUE IOHEXOL 300 MG/ML  SOLN

[Series 3: a/p w/ 5mm · axial · 0.97mm/px · z∈[+738,+1253]mm · 14 of 113 slices shown, 16 images]
[im 5/113  soft-tissue]
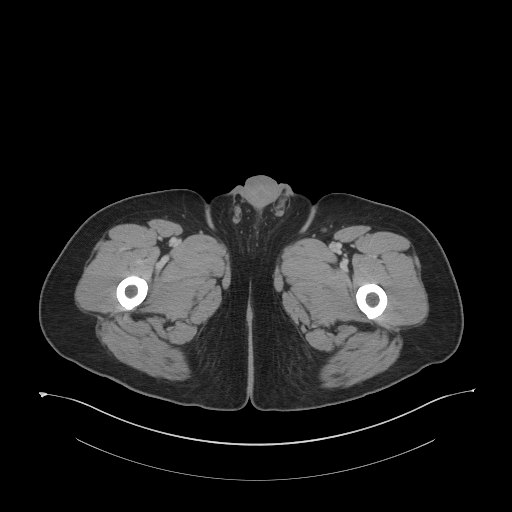
[im 5/113  bone]
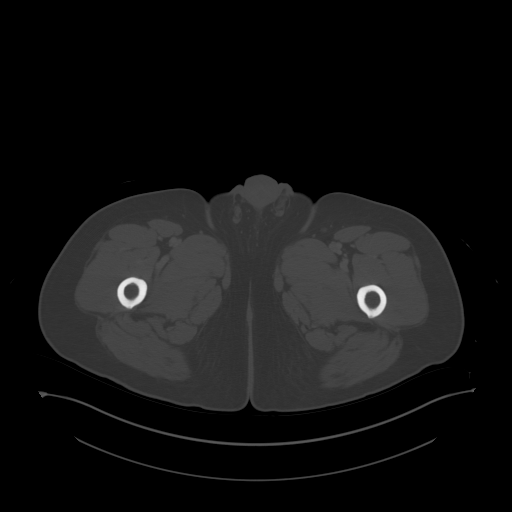
[im 15/113  soft-tissue]
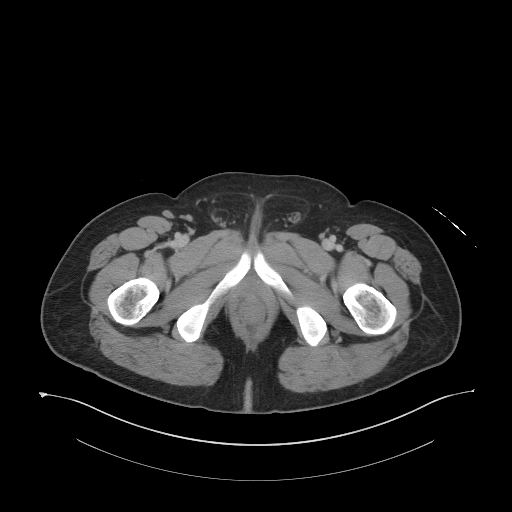
[im 24/113  soft-tissue]
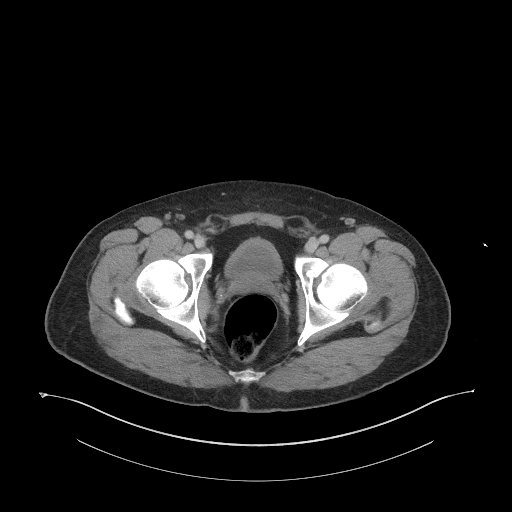
[im 29/113  soft-tissue]
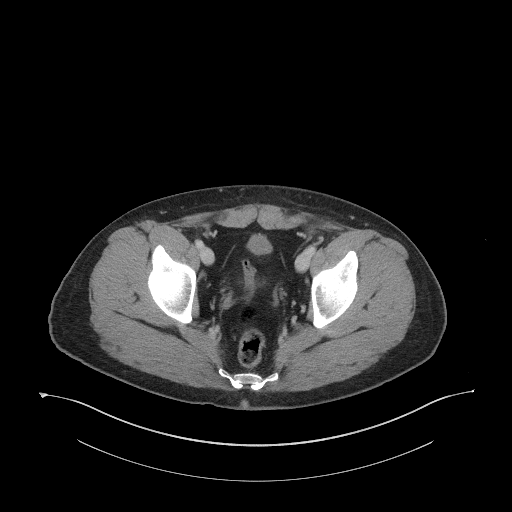
[im 38/113  soft-tissue]
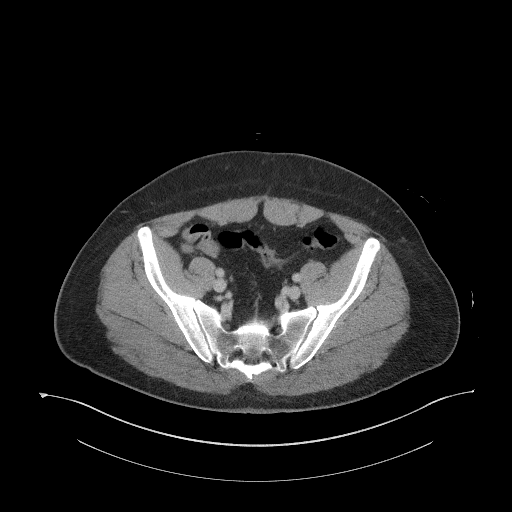
[im 47/113  soft-tissue]
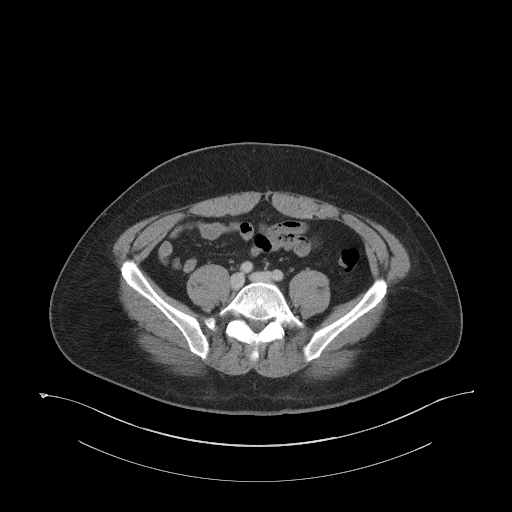
[im 52/113  soft-tissue]
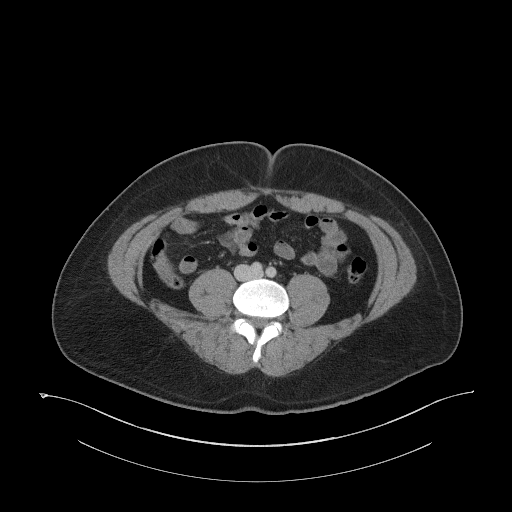
[im 61/113  soft-tissue]
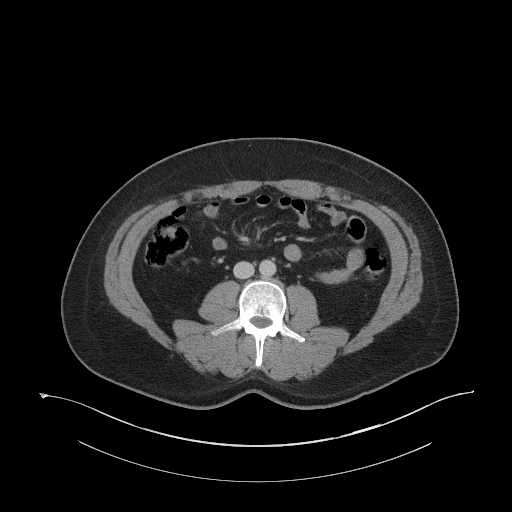
[im 66/113  soft-tissue]
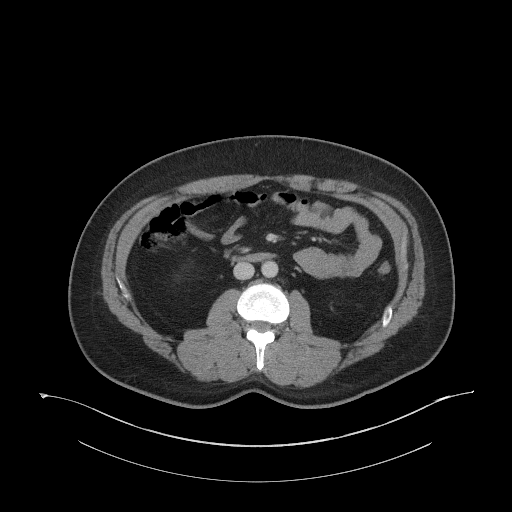
[im 66/113  bone]
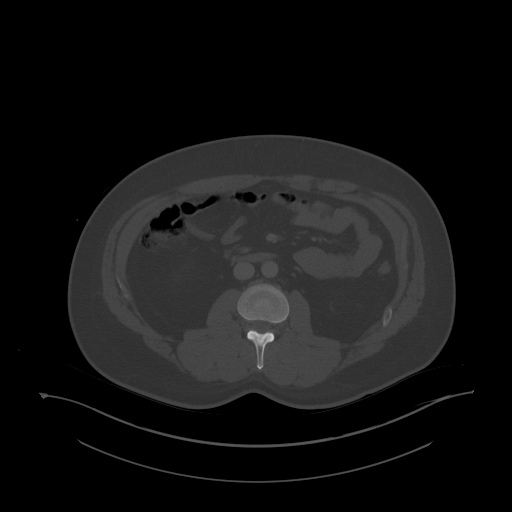
[im 75/113  soft-tissue]
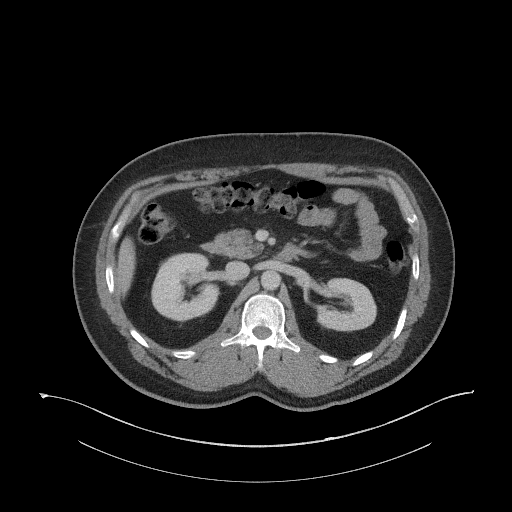
[im 85/113  soft-tissue]
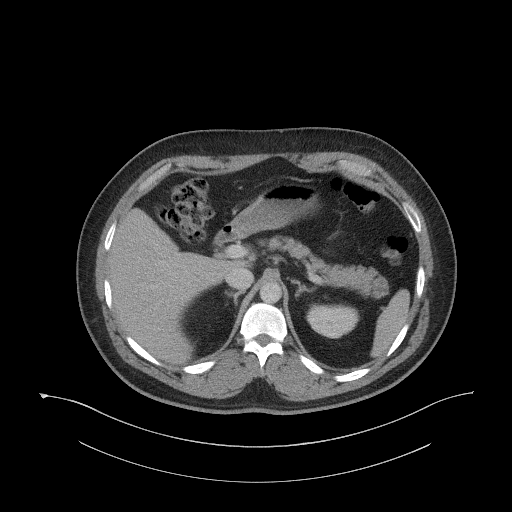
[im 89/113  soft-tissue]
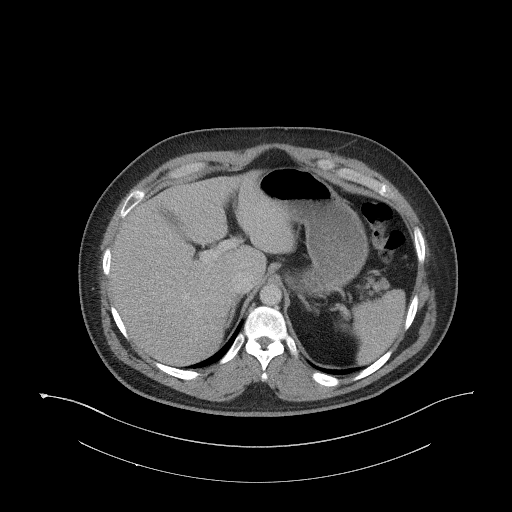
[im 99/113  soft-tissue]
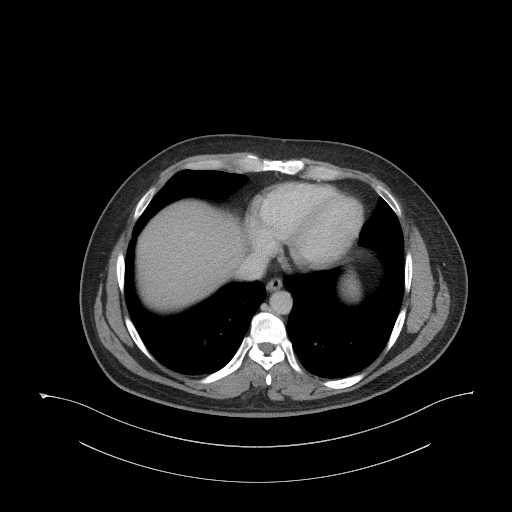
[im 108/113  soft-tissue]
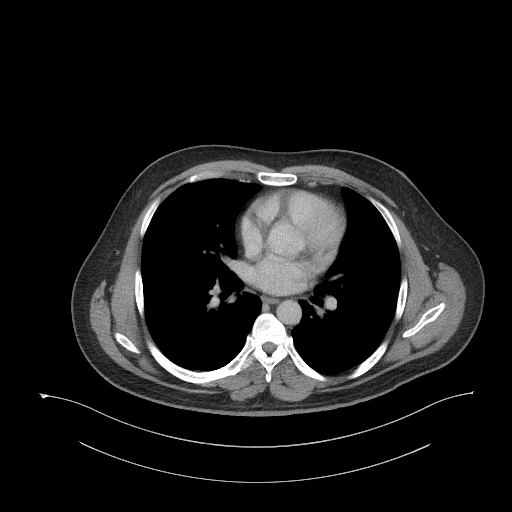

[Series 6: a/p w/ cor · coronal · 1.03mm/px · 3 of 162 slices shown]
[im 54/162  soft-tissue]
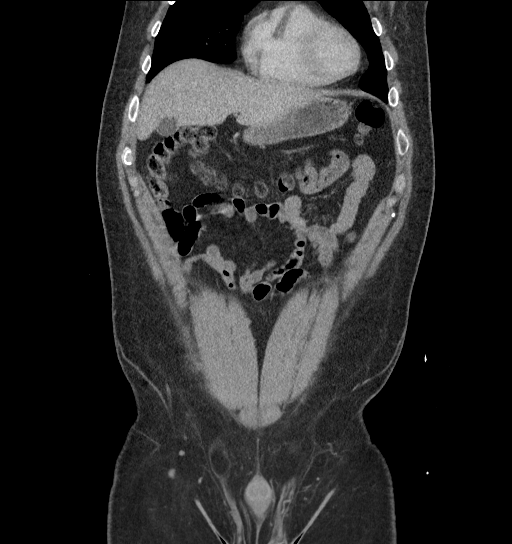
[im 72/162  soft-tissue]
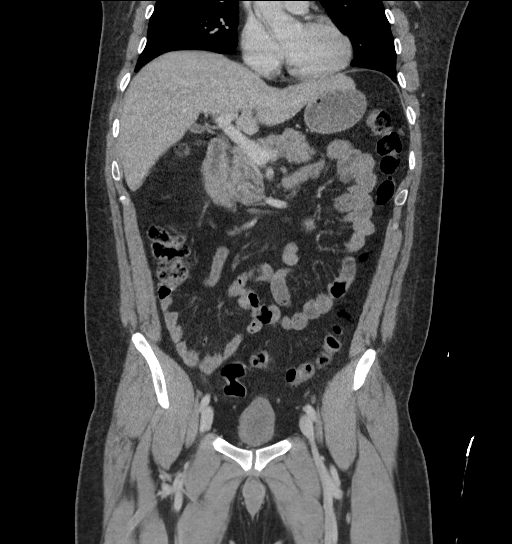
[im 90/162  soft-tissue]
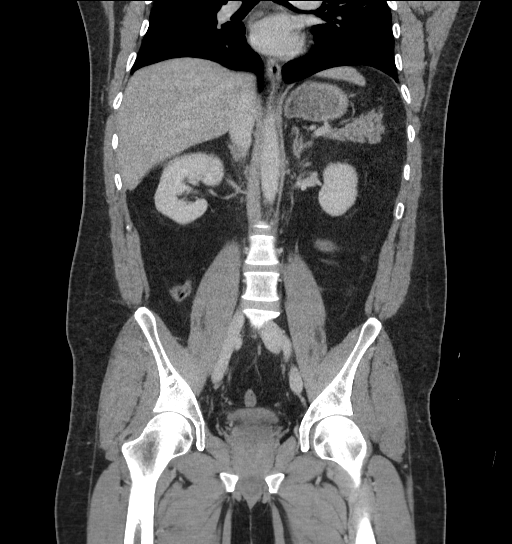

[17 of 46 positions shown; findings below may reference images not displayed]

FINDINGS: Lower chest: No acute abnormality.

Hepatobiliary: No focal liver abnormality is seen. No gallstones,
gallbladder wall thickening, or biliary dilatation.

Pancreas: Unremarkable. No pancreatic ductal dilatation or
surrounding inflammatory changes.

Spleen: Normal in size without focal abnormality.

Adrenals/Urinary Tract: Adrenal glands are unremarkable. Kidneys are
normal, without renal calculi, focal lesion, or hydronephrosis.
Bladder is unremarkable.

Stomach/Bowel: Stomach is within normal limits. Appendix appears
normal. No evidence of bowel wall thickening, distention, or
inflammatory changes.

Vascular/Lymphatic: No significant vascular findings are present. No
enlarged abdominal or pelvic lymph nodes.

Reproductive: Prostate is unremarkable.

Other: No abdominal wall hernia or abnormality. No abdominopelvic
ascites.

Musculoskeletal: No acute or significant osseous findings.
IMPRESSION: No abnormality seen in the abdomen or pelvis.

## 2022-01-05 ENCOUNTER — Encounter: Payer: Self-pay | Admitting: Physician Assistant

## 2022-03-19 IMAGING — US US THYROID
1 series · 13 of 25 positions shown · non-contrast
Comparison: None.

CLINICAL DATA: Enlarged thyroid gland.

EXAM:
THYROID ULTRASOUND
TECHNIQUE: Ultrasound examination of the thyroid gland and adjacent soft
tissues was performed.

[Series 1: us thyroid · 0.08mm/px · 13 of 41 slices shown]
[im 1/41]
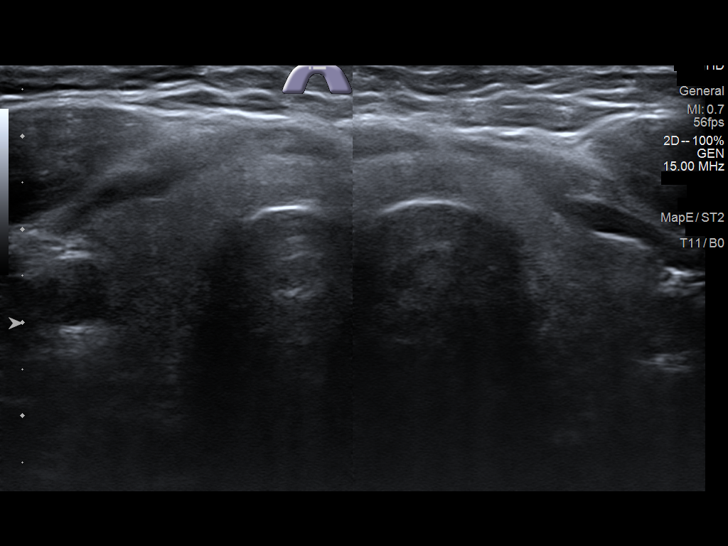
[im 4/41]
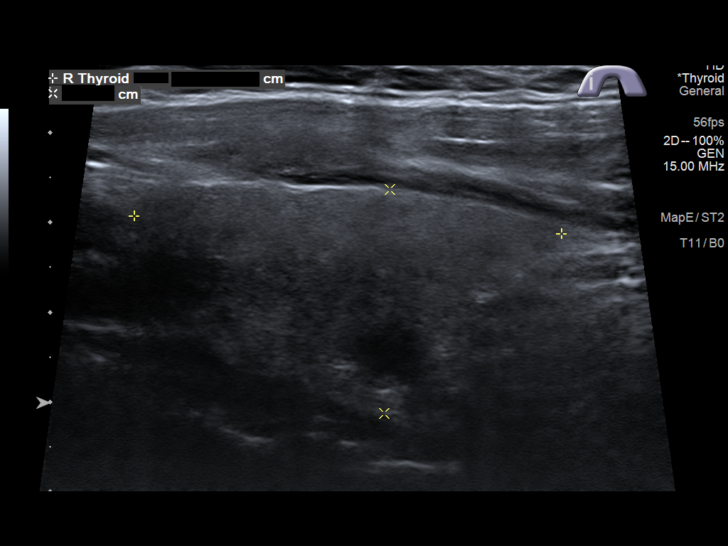
[im 7/41]
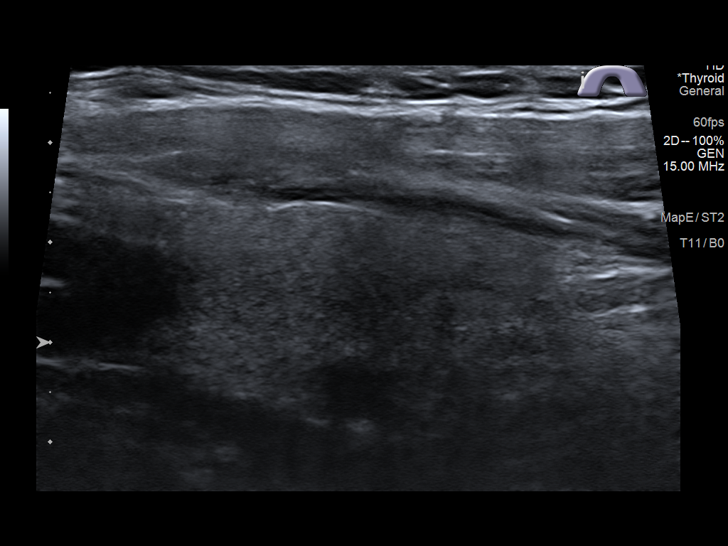
[im 11/41]
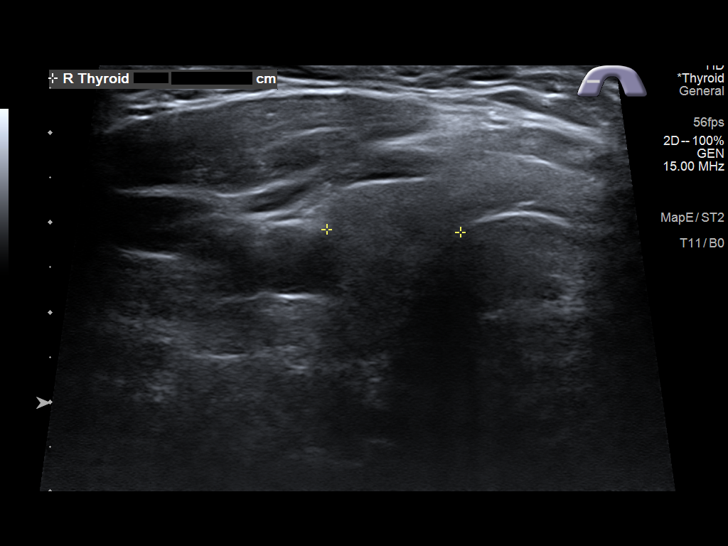
[im 14/41]
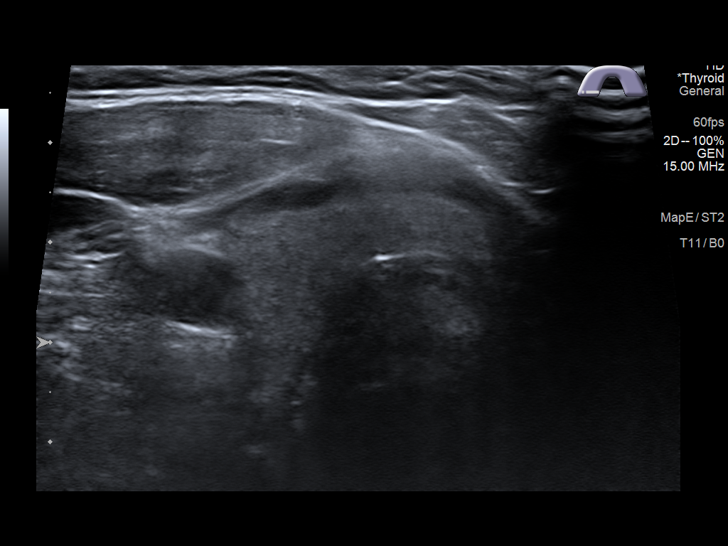
[im 17/41]
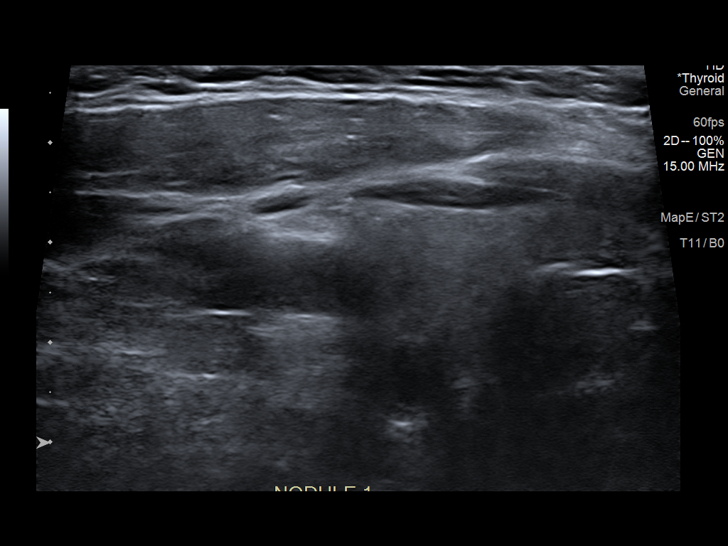
[im 21/41]
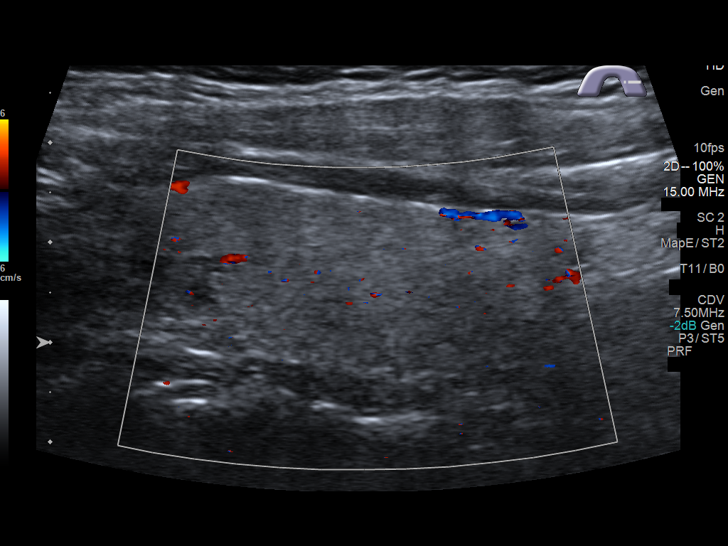
[im 24/41]
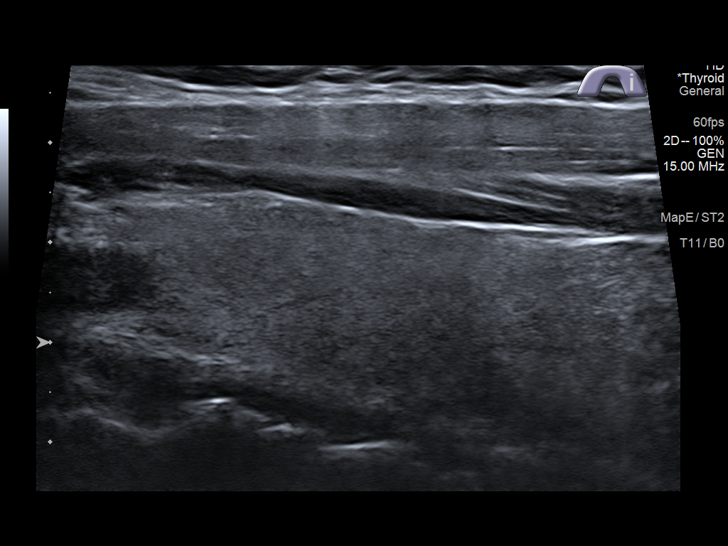
[im 27/41]
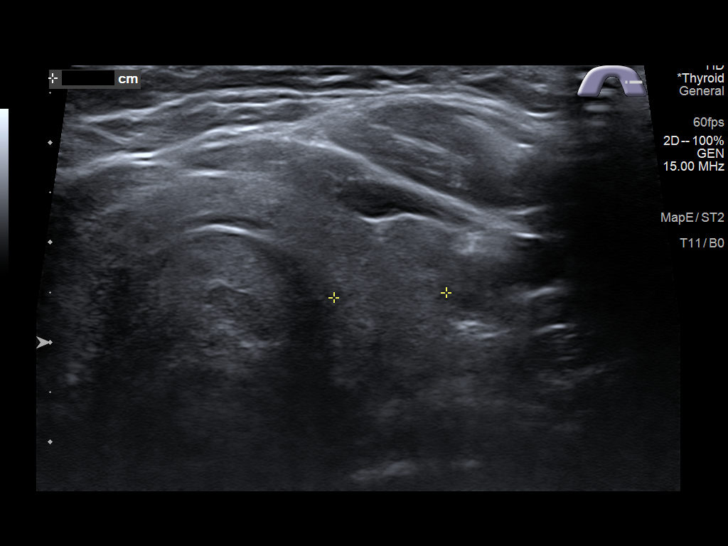
[im 31/41]
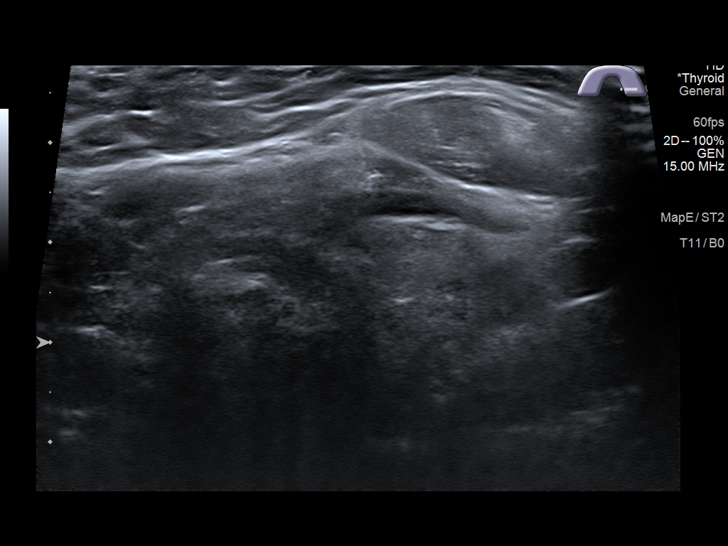
[im 34/41]
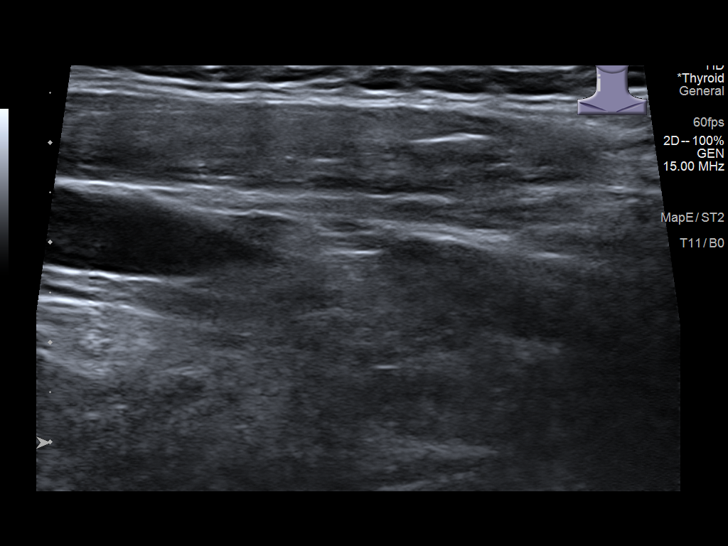
[im 37/41]
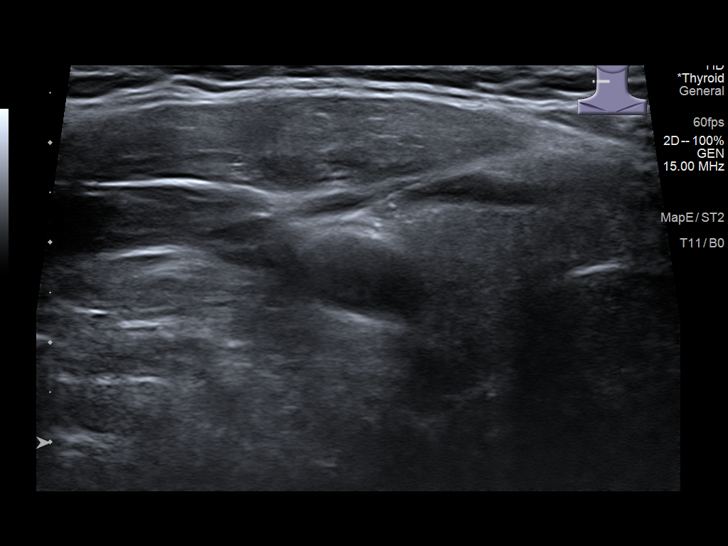
[im 41/41]
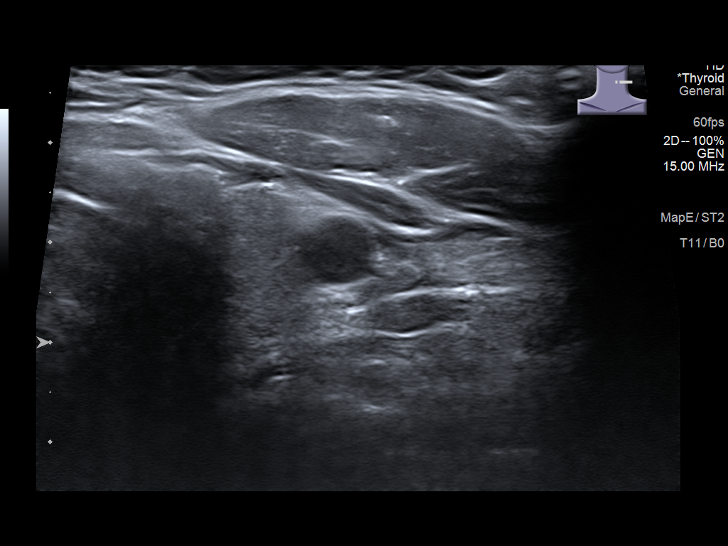

[13 of 25 positions shown; findings below may reference images not displayed]

FINDINGS: Parenchymal Echotexture: Mildly heterogenous

Isthmus: 0.5 cm

Right lobe: 4.8 x 2.5 x 1.5 cm

Left lobe: 4.5 x 1.9 x 1.1 cm

_________________________________________________________

Estimated total number of nodules >/= 1 cm: 0

Number of spongiform nodules >/=  2 cm not described below (TR1): 0

Number of mixed cystic and solid nodules >/= 1.5 cm not described
below (TR2): 0

_________________________________________________________

Nodule # 1:

Location: Right; Inferior

Maximum size: 0.7 cm; Other 2 dimensions: 0.6 x 0.6 cm

Composition: cannot determine (2)

Echogenicity: very hypoechoic (3)

Shape: taller-than-wide (3)

Margins: ill-defined (0)

Echogenic foci: none (0)

ACR TI-RADS total points: 8 to.

ACR TI-RADS risk category: TR5 (>/= 7 points).

ACR TI-RADS recommendations:

*Given size (>/= 0.5 - 0.9 cm) and appearance, a follow-up
ultrasound in 1 year should be considered based on TI-RADS criteria.

_________________________________________________________

No additional thyroid nodule is identified on today's study.
IMPRESSION: 1. Mildly heterogeneous, normal sized thyroid gland as detailed
above.
2. There is a 0.7 cm TR5 hypoechoic nodule in the right inferior
thyroid gland that is not definitively cystic. As such, a 1 year
follow-up ultrasound is recommended.

The above is in keeping with the ACR TI-RADS recommendations - [HOSPITAL] 4693;[DATE].

## 2022-04-22 ENCOUNTER — Ambulatory Visit: Payer: Medicaid Other

## 2022-04-29 ENCOUNTER — Ambulatory Visit: Payer: Medicaid Other

## 2022-05-20 ENCOUNTER — Ambulatory Visit: Payer: Medicaid Other

## 2022-05-27 ENCOUNTER — Ambulatory Visit: Payer: Medicaid Other

## 2022-05-27 ENCOUNTER — Ambulatory Visit: Payer: Medicaid Other | Admitting: Physician Assistant

## 2022-05-27 NOTE — Progress Notes (Deleted)
      Established patient visit   Patient: Raymond Knox   DOB: [birth year]   37 y.o. Male  MRN: [id number] Visit Date: 05/27/2022  Today's healthcare provider: Mikey Kirschner, PA-C   No chief complaint on file.  Subjective    HPI  ***  Medications: Outpatient Medications Prior to Visit  Medication Sig   EPINEPHrine 0.3 mg/0.3 mL IJ SOAJ injection Inject 0.3 mLs (0.3 mg total) into the muscle as needed for anaphylaxis.   escitalopram (LEXAPRO) 10 MG tablet Take 1 tablet (10 mg total) by mouth daily.   esomeprazole (NEXIUM) 40 MG capsule NEXIUM 40 MG TWICE DAILY   triamcinolone ointment (KENALOG) 0.5 % Apply 1 application topically 2 (two) times daily. Thin layer to hands   No facility-administered medications prior to visit.    Review of Systems    Objective    There were no vitals taken for this visit.   Physical Exam  ***  No results found for any visits on 05/27/22.  Assessment & Plan     ***  No follow-ups on file.      I, Mikey Kirschner, PA-C have reviewed all documentation for this visit. The documentation on  05/27/22  for the exam, diagnosis, procedures, and orders are all accurate and complete.  Mikey Kirschner, PA-C [facility] [address] [place], [place], [address] Office: [phone] Fax: [place]

## 2022-06-05 ENCOUNTER — Ambulatory Visit: Payer: Medicaid Other | Admitting: Physician Assistant

## 2022-06-25 NOTE — Progress Notes (Deleted)
     I,Sha'taria Vaniyah Lansky,acting as a Education administrator for [employer], PA-C.,have documented all relevant documentation on the behalf of Mikey Kirschner, PA-C,as directed by  Mikey Kirschner, PA-C while in the presence of Mikey Kirschner, PA-C.   Established patient visit   Patient: Raymond Knox   DOB: 06/19/1985   37 y.o. Male  MRN: [id number] Visit Date: 06/26/2022  Today's healthcare provider: Mikey Kirschner, PA-C   No chief complaint on file.  Subjective    Back Pain   ***  Medications: Outpatient Medications Prior to Visit  Medication Sig  . EPINEPHrine 0.3 mg/0.3 mL IJ SOAJ injection Inject 0.3 mLs (0.3 mg total) into the muscle as needed for anaphylaxis.  Marland Kitchen escitalopram (LEXAPRO) 10 MG tablet Take 1 tablet (10 mg total) by mouth daily.  Marland Kitchen esomeprazole (NEXIUM) 40 MG capsule NEXIUM 40 MG TWICE DAILY  . triamcinolone ointment (KENALOG) 0.5 % Apply 1 application topically 2 (two) times daily. Thin layer to hands   No facility-administered medications prior to visit.    Review of Systems  Musculoskeletal:  Positive for back pain.   {Labs  Heme  Chem  Endocrine  Serology  Results Review (optional):23779}   Objective    There were no vitals taken for this visit. {Show previous vital signs (optional):23777}  Physical Exam  ***  No results found for any visits on 06/26/22.  Assessment & Plan     ***  No follow-ups on file.      {provider attestation***:1}   Mikey Kirschner, PA-C  [place] [phone] (phone) [phone] (fax)  [place]

## 2022-06-26 ENCOUNTER — Ambulatory Visit: Payer: Medicaid Other | Admitting: Physician Assistant

## 2022-07-20 NOTE — Progress Notes (Deleted)
      Established patient visit   Patient: Raymond Knox   DOB: 01-Feb-1986   37 y.o. Male  MRN: [id number] Visit Date: 07/21/2022  Today's healthcare provider: Alfredia Ferguson, PA-C   No chief complaint on file.  Subjective    Back Pain   ***  Medications: Outpatient Medications Prior to Visit  Medication Sig  . EPINEPHrine 0.3 mg/0.3 mL IJ SOAJ injection Inject 0.3 mLs (0.3 mg total) into the muscle as needed for anaphylaxis.  Marland Kitchen escitalopram (LEXAPRO) 10 MG tablet Take 1 tablet (10 mg total) by mouth daily.  Marland Kitchen esomeprazole (NEXIUM) 40 MG capsule NEXIUM 40 MG TWICE DAILY  . triamcinolone ointment (KENALOG) 0.5 % Apply 1 application topically 2 (two) times daily. Thin layer to hands   No facility-administered medications prior to visit.    Review of Systems  Musculoskeletal:  Positive for back pain.   {Labs  Heme  Chem  Endocrine  Serology  Results Review (optional):23779}   Objective    There were no vitals taken for this visit. {Show previous vital signs (optional):23777}  Physical Exam  ***  No results found for any visits on 07/21/22.  Assessment & Plan     ***  No follow-ups on file.      {provider attestation***:1}   Alfredia Ferguson, PA-C  [facility] [phone] (phone) [phone] (fax)  [facility]

## 2022-07-21 ENCOUNTER — Ambulatory Visit: Payer: Medicaid Other | Admitting: Physician Assistant

## 2022-07-21 ENCOUNTER — Encounter: Payer: Self-pay | Admitting: Physician Assistant

## 2022-09-21 NOTE — Progress Notes (Unsigned)
     I,Neli Fofana,acting as a Neurosurgeon for [employer], PA-C.,have documented all relevant documentation on the behalf of Alfredia Ferguson, PA-C,as directed by  Alfredia Ferguson, PA-C while in the presence of Alfredia Ferguson, PA-C.   Established patient visit   Patient: Raymond Knox   DOB: 1986-03-16   37 y.o. Male  MRN: [id number] Visit Date: 09/22/2022  Today's healthcare provider: Alfredia Ferguson, PA-C   No chief complaint on file.  Subjective    HPI  ***  Medications: Outpatient Medications Prior to Visit  Medication Sig   EPINEPHrine 0.3 mg/0.3 mL IJ SOAJ injection Inject 0.3 mLs (0.3 mg total) into the muscle as needed for anaphylaxis.   escitalopram (LEXAPRO) 10 MG tablet Take 1 tablet (10 mg total) by mouth daily.   esomeprazole (NEXIUM) 40 MG capsule NEXIUM 40 MG TWICE DAILY   triamcinolone ointment (KENALOG) 0.5 % Apply 1 application topically 2 (two) times daily. Thin layer to hands   No facility-administered medications prior to visit.    Review of Systems  {Labs  Heme  Chem  Endocrine  Serology  Results Review (optional):23779}   Objective    There were no vitals taken for this visit. {Show previous Mannie Ohlin signs (optional):23777}  Physical Exam  ***  No results found for any visits on 09/22/22.  Assessment & Plan     ***  No follow-ups on file.      {provider attestation***:1}   Alfredia Ferguson, PA-C  [facility] [phone] (phone) [phone] (fax)  [facility]

## 2022-09-22 ENCOUNTER — Ambulatory Visit: Payer: Medicaid Other | Admitting: Physician Assistant

## 2022-09-22 ENCOUNTER — Encounter: Payer: Self-pay | Admitting: Physician Assistant

## 2022-09-22 VITALS — BP 132/84 | HR 90 | Resp 15 | Ht 67.0 in | Wt 253.5 lb

## 2022-09-22 DIAGNOSIS — M25511 Pain in right shoulder: Secondary | ICD-10-CM | POA: Diagnosis not present

## 2022-09-22 MED ORDER — TRAMADOL HCL 50 MG PO TABS: 50.0000 mg | ORAL_TABLET | Freq: Three times a day (TID) | ORAL | 0 refills | Status: AC | PRN

## 2022-11-05 ENCOUNTER — Ambulatory Visit: Payer: Medicaid Other | Admitting: Physician Assistant
